# Patient Record
Sex: Female | Born: 1980
Health system: Southern US, Community
[De-identification: ages and names within clinical notes are randomized; demographics above are authoritative.]

## PROBLEM LIST (undated history)

## (undated) DIAGNOSIS — F32A Depression, unspecified: Secondary | ICD-10-CM

## (undated) DIAGNOSIS — Z8669 Personal history of other diseases of the nervous system and sense organs: Secondary | ICD-10-CM

## (undated) DIAGNOSIS — F329 Major depressive disorder, single episode, unspecified: Secondary | ICD-10-CM

## (undated) DIAGNOSIS — T7840XA Allergy, unspecified, initial encounter: Secondary | ICD-10-CM

## (undated) DIAGNOSIS — B9689 Other specified bacterial agents as the cause of diseases classified elsewhere: Secondary | ICD-10-CM

## (undated) DIAGNOSIS — M51369 Other intervertebral disc degeneration, lumbar region without mention of lumbar back pain or lower extremity pain: Secondary | ICD-10-CM

## (undated) DIAGNOSIS — N898 Other specified noninflammatory disorders of vagina: Principal | ICD-10-CM

## (undated) DIAGNOSIS — F419 Anxiety disorder, unspecified: Secondary | ICD-10-CM

## (undated) DIAGNOSIS — N760 Acute vaginitis: Secondary | ICD-10-CM

## (undated) DIAGNOSIS — B001 Herpesviral vesicular dermatitis: Secondary | ICD-10-CM

## (undated) DIAGNOSIS — M5136 Other intervertebral disc degeneration, lumbar region: Secondary | ICD-10-CM

## (undated) DIAGNOSIS — I10 Essential (primary) hypertension: Secondary | ICD-10-CM

## (undated) HISTORY — DX: Herpesviral vesicular dermatitis: B00.1

## (undated) HISTORY — DX: Personal history of other diseases of the nervous system and sense organs: Z86.69

## (undated) HISTORY — DX: Depression, unspecified: F32.A

## (undated) HISTORY — PX: ABDOMINAL HYSTERECTOMY: SHX81

## (undated) HISTORY — DX: Other intervertebral disc degeneration, lumbar region: M51.36

## (undated) HISTORY — DX: Essential (primary) hypertension: I10

## (undated) HISTORY — PX: LAPAROSCOPIC GASTRIC SLEEVE RESECTION: SHX5895

## (undated) HISTORY — PX: CHOLECYSTECTOMY: SHX55

## (undated) HISTORY — DX: Allergy, unspecified, initial encounter: T78.40XA

## (undated) HISTORY — DX: Acute vaginitis: N76.0

## (undated) HISTORY — DX: Anxiety disorder, unspecified: F41.9

## (undated) HISTORY — PX: TUBAL LIGATION: SHX77

## (undated) HISTORY — DX: Other intervertebral disc degeneration, lumbar region without mention of lumbar back pain or lower extremity pain: M51.369

## (undated) HISTORY — DX: Other specified noninflammatory disorders of vagina: N89.8

## (undated) HISTORY — PX: ENDOMETRIAL ABLATION: SHX621

## (undated) HISTORY — DX: Morbid (severe) obesity due to excess calories: E66.01

## (undated) HISTORY — DX: Other specified bacterial agents as the cause of diseases classified elsewhere: B96.89

## (undated) HISTORY — DX: Major depressive disorder, single episode, unspecified: F32.9

---
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
## 2003-05-16 ENCOUNTER — Other Ambulatory Visit: Admission: RE | Admit: 2003-05-16 | Discharge: 2003-05-16 | Payer: Self-pay | Admitting: Dermatology

## 2003-08-27 ENCOUNTER — Ambulatory Visit (HOSPITAL_COMMUNITY): Admission: RE | Admit: 2003-08-27 | Discharge: 2003-08-27 | Payer: Self-pay | Admitting: Family Medicine

## 2003-12-17 ENCOUNTER — Ambulatory Visit (HOSPITAL_COMMUNITY): Admission: RE | Admit: 2003-12-17 | Discharge: 2003-12-17 | Payer: Self-pay | Admitting: Family Medicine

## 2003-12-19 ENCOUNTER — Emergency Department (HOSPITAL_COMMUNITY): Admission: EM | Admit: 2003-12-19 | Discharge: 2003-12-19 | Payer: Self-pay | Admitting: Emergency Medicine

## 2004-10-09 ENCOUNTER — Ambulatory Visit (HOSPITAL_COMMUNITY): Admission: RE | Admit: 2004-10-09 | Discharge: 2004-10-10 | Payer: Self-pay | Admitting: Obstetrics and Gynecology

## 2004-10-16 ENCOUNTER — Inpatient Hospital Stay (HOSPITAL_COMMUNITY): Admission: RE | Admit: 2004-10-16 | Discharge: 2004-10-18 | Payer: Self-pay | Admitting: Obstetrics and Gynecology

## 2004-11-20 ENCOUNTER — Ambulatory Visit (HOSPITAL_COMMUNITY): Admission: RE | Admit: 2004-11-20 | Discharge: 2004-11-20 | Payer: Self-pay | Admitting: Obstetrics and Gynecology

## 2004-11-21 ENCOUNTER — Observation Stay (HOSPITAL_COMMUNITY): Admission: EM | Admit: 2004-11-21 | Discharge: 2004-11-22 | Payer: Self-pay | Admitting: Emergency Medicine

## 2005-03-11 ENCOUNTER — Ambulatory Visit (HOSPITAL_COMMUNITY): Admission: RE | Admit: 2005-03-11 | Discharge: 2005-03-11 | Payer: Self-pay | Admitting: Family Medicine

## 2005-07-26 ENCOUNTER — Encounter
Admission: RE | Admit: 2005-07-26 | Discharge: 2005-10-24 | Payer: Self-pay | Admitting: Physical Medicine & Rehabilitation

## 2005-07-26 ENCOUNTER — Ambulatory Visit: Payer: Self-pay | Admitting: Physical Medicine & Rehabilitation

## 2005-08-15 IMAGING — US US TRANSVAGINAL NON-OB
1 series · 14 of 25 positions shown · non-contrast
Comparison: none

**DUPLICATE COPY for exam association in [facility] ? No change from original report, 08/28/03**
CLINICAL DATA: Chronic abdominal pain.  
 TRANSABDOMINAL AND TRANSVAGINAL PELVIC SONOGRAM

[Series 1: unknown · 0.26mm/px · 14 of 49 slices shown]
[im 1/49]
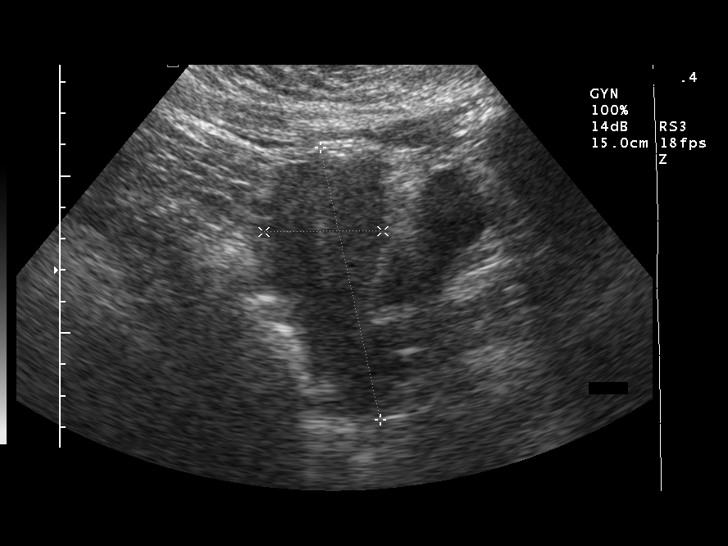
[im 5/49]
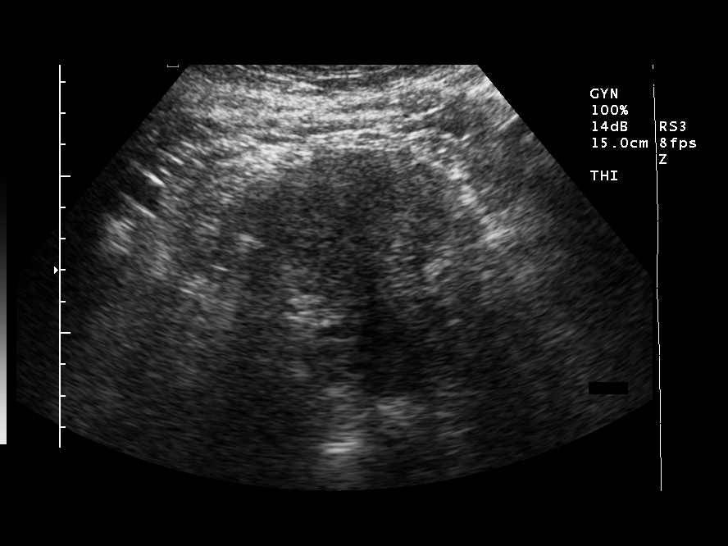
[im 9/49]
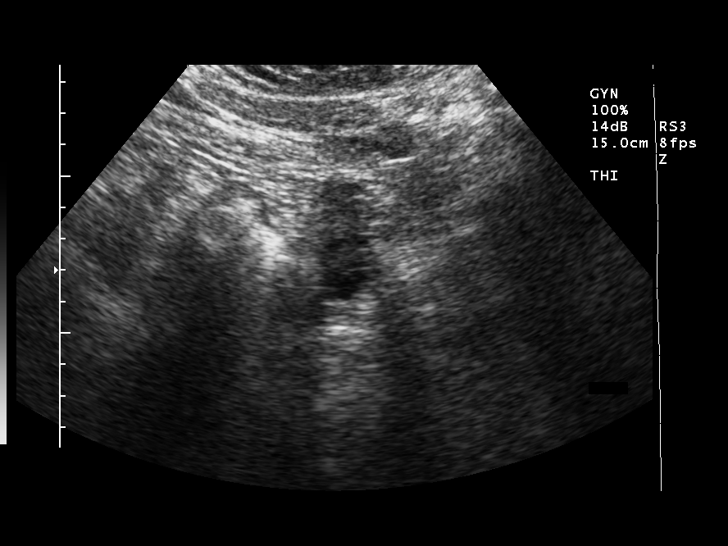
[im 13/49]
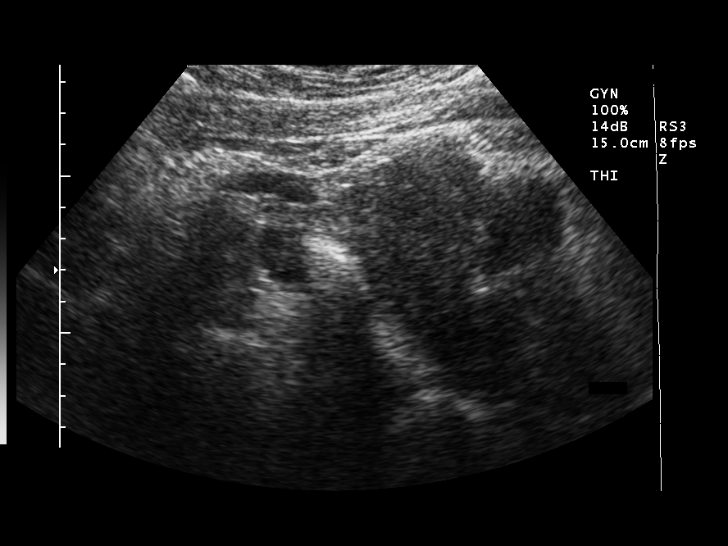
[im 17/49]
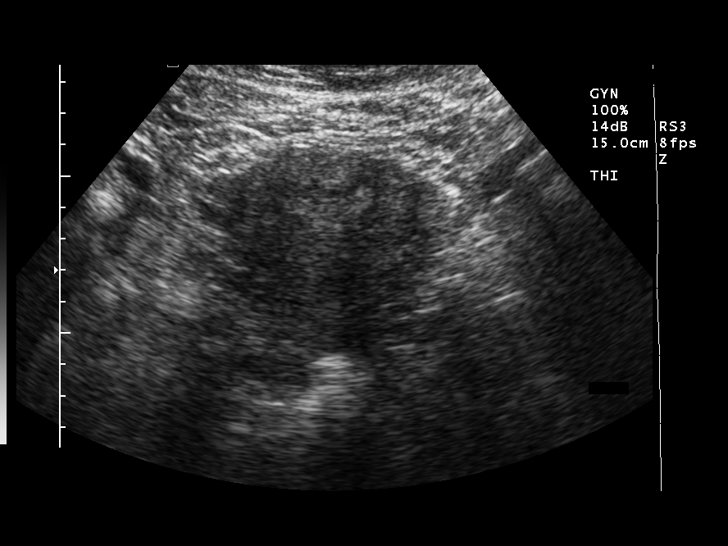
[im 19/49]
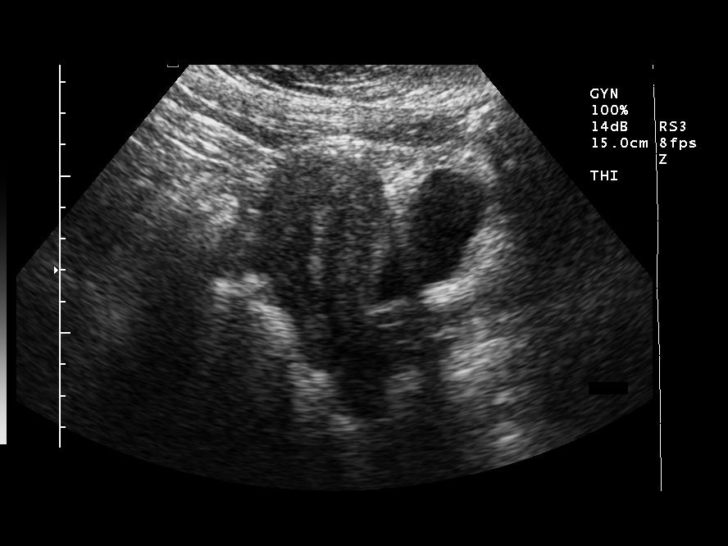
[im 23/49]
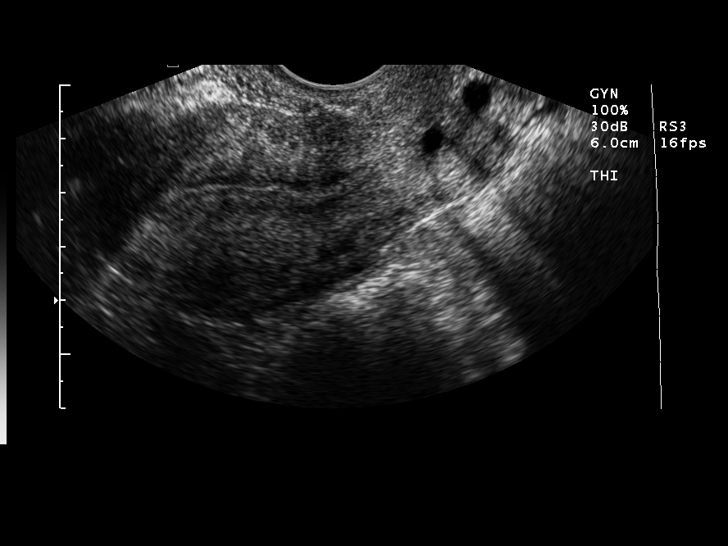
[im 27/49]
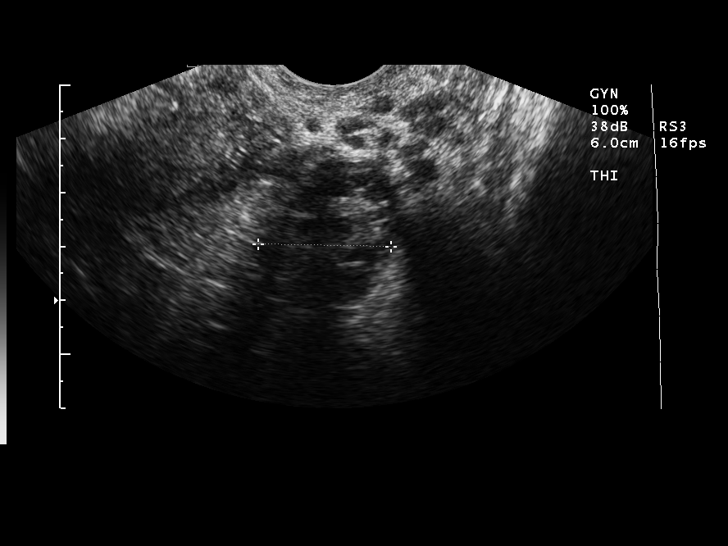
[im 31/49]
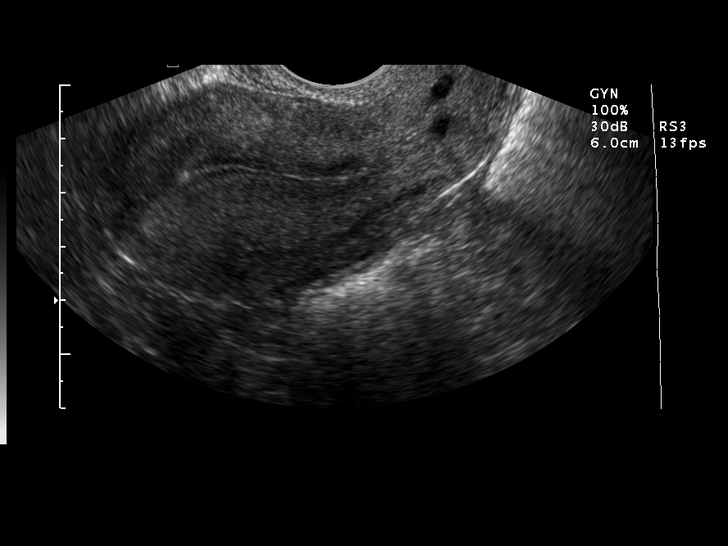
[im 33/49]
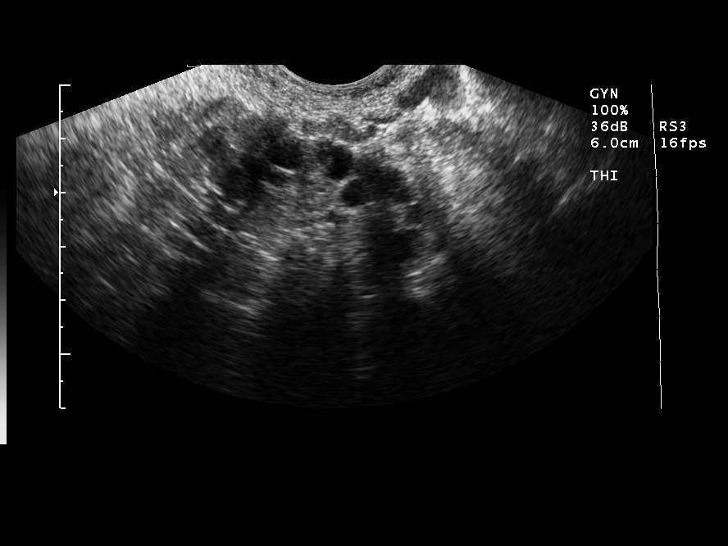
[im 37/49]
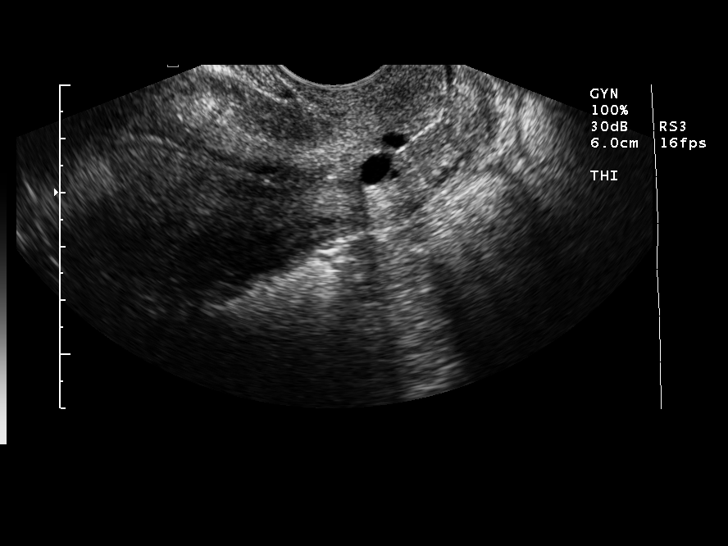
[im 41/49]
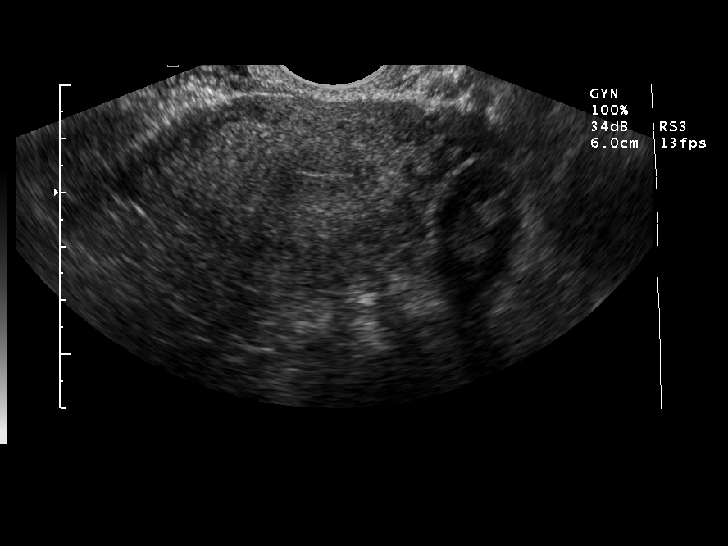
[im 45/49]
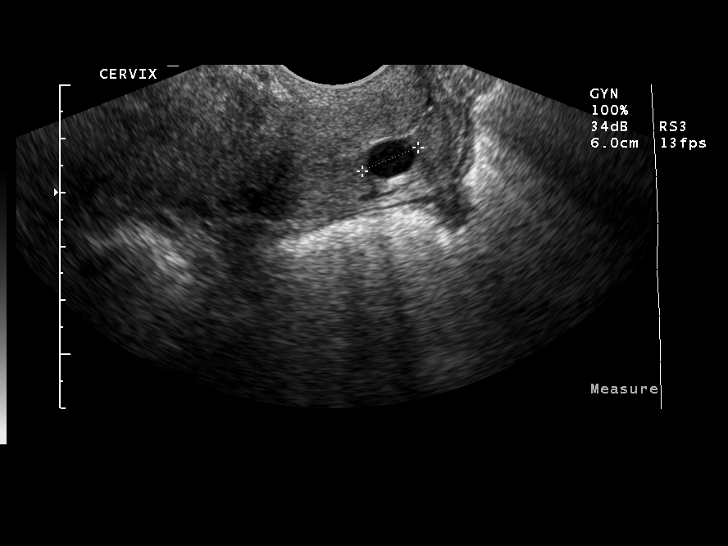
[im 49/49]
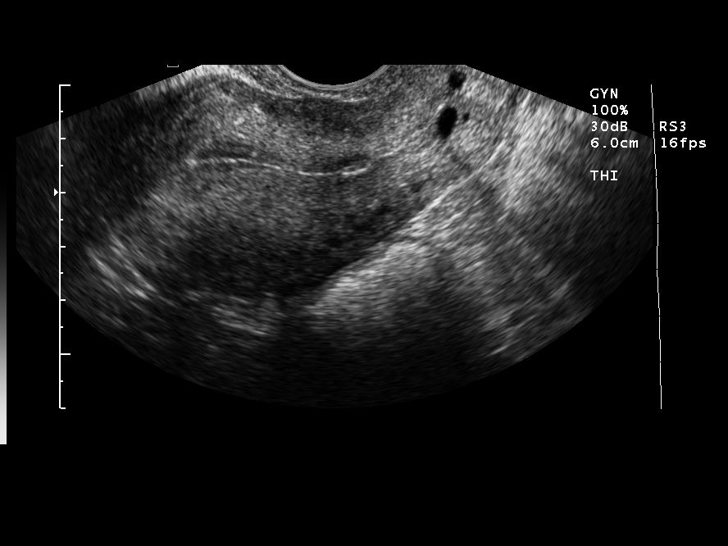

[14 of 25 positions shown; findings below may reference images not displayed]

FINDINGS: Uterus measures 8.9 X 3.8 X 6.4 cm.  Endometrial lining thickness 10 mm.  Small Nabothian cysts noted with the largest measuring up to 9 mm.  
 Right ovary measures 3.6 X 1.8 X 2.0 cm with the left ovary measuring 2.9 X 2.4 X 2.5 cm.  Several follicles noted within both ovaries without a dominant lesion.  No free fluid in the cul-de-sac.  
 IMPRESSION
 Nabothian cysts noted.  
 No dominant ovarian mass.

## 2012-03-02 ENCOUNTER — Ambulatory Visit (INDEPENDENT_AMBULATORY_CARE_PROVIDER_SITE_OTHER): Payer: Self-pay | Admitting: Surgery

## 2012-06-27 ENCOUNTER — Ambulatory Visit: Payer: Self-pay | Admitting: Family Medicine

## 2012-06-27 ENCOUNTER — Encounter: Payer: Self-pay | Admitting: Family Medicine

## 2012-06-27 ENCOUNTER — Ambulatory Visit (INDEPENDENT_AMBULATORY_CARE_PROVIDER_SITE_OTHER): Payer: Managed Care, Other (non HMO) | Admitting: Family Medicine

## 2012-06-27 VITALS — BP 128/89 | HR 80 | Ht 63.0 in | Wt 255.0 lb

## 2012-06-27 DIAGNOSIS — G43909 Migraine, unspecified, not intractable, without status migrainosus: Secondary | ICD-10-CM

## 2012-06-27 DIAGNOSIS — F411 Generalized anxiety disorder: Secondary | ICD-10-CM

## 2012-06-27 MED ORDER — NADOLOL 20 MG PO TABS: 20.0000 mg | ORAL_TABLET | Freq: Every day | ORAL | Status: DC

## 2012-06-27 MED ORDER — FLUOXETINE HCL 20 MG PO CAPS: 20.0000 mg | ORAL_CAPSULE | Freq: Every day | ORAL | Status: DC

## 2012-06-27 MED ORDER — PROMETHAZINE HCL 12.5 MG PO TABS: 12.5000 mg | ORAL_TABLET | Freq: Four times a day (QID) | ORAL | Status: DC | PRN

## 2012-06-27 NOTE — Progress Notes (Signed)
  Subjective:    Patient ID: Crystal Rojas, female    DOB: [birth year], 32 y.o.   MRN: [id number]  Crystal Rojas is here today to discuss the conditions listed below.  1) Migraine Headaches:  She took the combination of Topamax and Maxalt for about three weeks.  She stopped them because she did not feel well on the combination.  She says that she felt that she was going "crazy" while on these medications.  She stopped them and has been taking Exedrin with little relief.   2)   Anxiety:  She weaned herself off her Cymbalta and she feels since then she has been having chest pain.  Her anixety level also has increased.   Migraine  This is a chronic problem. The current episode started more than 1 year ago. The problem occurs intermittently (2 or more per week). The pain is located in the frontal, occipital, parietal, temporal and bilateral region. The pain does not radiate. The pain quality is similar to prior headaches. The quality of the pain is described as throbbing, aching, boring, dull, pulsating, sharp and stabbing. Pain scale: "Sick Migraine - 10/10; Regular 7/10"  Pain severity now: Ranges from mild to severe  Associated symptoms include blurred vision, nausea, phonophobia, photophobia, scalp tenderness, sinus pressure and vomiting. Pertinent negatives include no eye pain or neck pain. The symptoms are aggravated by bright light, caffeine withdrawal, coughing, emotional stress, fatigue, noise and sneezing. She has tried acetaminophen, Excedrin and triptans for the symptoms.  Anxiety Presents for initial visit. Onset was 1 to 4 weeks ago. The problem has been unchanged. Symptoms include chest pain, depressed mood, excessive worry, irritability, malaise, nausea and nervous/anxious behavior. Patient reports no palpitations, panic, restlessness or suicidal ideas. Symptoms occur most days. The severity of symptoms is moderate. The symptoms are aggravated by family issues and work stress. The quality of sleep is  good. Nighttime awakenings: none.   Risk factors include a major life event (Husband being out of work ). Her past medical history is significant for anxiety/panic attacks and depression. Treatments tried: Cymbalta  The treatment provided significant relief. Compliance with prior treatments has been good. Past compliance problems: Cost of medication      Review of Systems  Constitutional: Positive for irritability.  HENT: Positive for sinus pressure. Negative for neck pain.   Eyes: Positive for blurred vision and photophobia. Negative for pain.  Cardiovascular: Positive for chest pain. Negative for palpitations.  Gastrointestinal: Positive for nausea and vomiting.  Psychiatric/Behavioral: Negative for suicidal ideas. The patient is nervous/anxious.        Objective:   Physical Exam  Constitutional: She appears well-nourished. No distress.  HENT:  Head: Normocephalic.  Eyes: No scleral icterus.  Neck: No thyromegaly present.  Cardiovascular: Normal rate, regular rhythm and normal heart sounds.   Pulmonary/Chest: Effort normal and breath sounds normal.  Abdominal: There is no tenderness.  Musculoskeletal: She exhibits no edema and no tenderness.  Neurological: She is alert.  Skin: Skin is warm and dry.  Psychiatric: Her behavior is normal. Judgment and thought content normal.  She is stressed.           Assessment & Plan:   TIME 30 MINUTES:  MORE THAN 50 % OF TIME WAS INVOLVED IN COUNSELING.

## 2012-06-27 NOTE — Patient Instructions (Addendum)
1)  MIgraine - Take 1 Nadolol daily for prevention.  If you still need more help with prevention start on some L-Tyrosine before trying the Topamax again.  Start with the 25 mg at night and increase very slowly.  You can also try some Migrelief.    2)  Mood - Start on Prozac first for a couple of weeks before starting the Nadolol.  You should notice less stress in 4-6 weeks.    Diet Following Bariatric Surgery The bariatric diet is designed to provide fluids and nourishment while promoting weight loss after bariatric surgery. The diet is divided into 3 stages. The rate of progression varies based on individual food tolerance. DIET FOLLOWING BARIATRIC SURGERY The diet following surgery is divided into 3 stages to allow a gradual adjustment. It is very important to the success of your surgery to:  Progress to each stage slowly.  Eat at set times.  Chew food well and stop eating when you are full.  Not drink liquids 30 minutes before and after meals. If you feel tightness or pressure in your chest, that means you are full. Wait 30 minutes before you try to eat again. STAGE 1 BARIATRIC DIET - ABOUT 2 WEEKS IN DURATION   The diet begins the day of surgery. It will last about 1 to 2 weeks after surgery. Your surgeon may have individual guidelines for you about specific foods or the progression of your diet. Follow your surgeon's guidelines.  If clear liquids are well-tolerated without vomiting, your caregiver will add a 4 oz to 6 oz high protein, low-calorie liquid supplement. You could add this to your meal plan 3 times daily. You will need at least 60 g to 80 g of protein daily or as determined by your Registered Dietitian.  Guidelines for choosing a protein supplement include:  At least 15 g of protein per 8 oz serving.  Less than 20 g total carbohydrate per 8 oz serving.  Less than 5 g fat per 8 oz serving.  Avoid carbonated beverages, caffeine, alcohol, and concentrated sweets such as  sugar, cakes, and cookies.  Right after surgery, you may only be able to eat 3 to 4 tsp per meal. Your maximum volume should not exceed  to  cup total. Do not eat or drink more than 1 oz or 2 tbs every 15 minutes.  Take a chewable multivitamin and mineral supplement.  Drink at least 48 oz of fluid daily, which includes your protein supplement. Food and beverages from the list below are allowed at set times (for example at 8 AM, 12 noon, or 5 PM):  Decaffeinated coffee or tea.  100% fruit juice.  Diet or sugar-free drinks.  Broth.  Blenderized soup.  Skim milk or lactose-free milk.  Sugar-free gelatin dessert or frozen ice pops.  Mashed potatoes.  Yogurt (artificially sweetened).  Sugar-free pudding.  Blended low-fat cottage cheese.  Unsweetened applesauce, grits, or hot wheat cereal. Four to six ounces of a liquid protein supplement from the list below is recommended for snacks at 10 AM, 2 PM, and 8 PM.  STAGE 2 BARIATRIC DIET (SOFT DIET) - ABOUT 4 WEEKS IN DURATION  About 2 weeks after surgery, your caregiver will progress your diet to this stage. Foods may need to be blended to the consistency of applesauce. Choose low-fat foods (less than 5 g of fat per serving) and avoid concentrated sweets and sugar (less than 10 g of sugar per serving). Meals should not exceed  to  cup total. This stage will last about 4 weeks. It is recommended that you meet with your dietitian at this stage to begin preparation for the last stage. This stage consists of 3 meals a day with a liquid protein supplement between meals twice daily. Do not drink liquids with foods. You must wait 30 minutes for the stomach pouch to empty before drinking. Chew food well. The food must be almost liquified before swallowing. Soft foods from the list below can now be slowly added to your diet:  Soft fruit (soft canned fruit in light syrup or natural juice, banana, melon, peaches, pears, or  strawberries).  Cooked vegetables.  Toast or crackers (becomes soft after chewing 20 times).  Hot wheat cereal.  Fish.  Eggs (scrambled, soft-boiled). STAGE 3 BARIATRIC DIET (REGULAR DIET) - ABOUT 6 to 8 WEEKS AFTER SURGERY About 6 to 8 weeks after surgery, you will be advanced to food that is regular in texture. This diet should include all food groups. The diet will continue to promote weight loss. Meals should not exceed  to 1 cup total. Your dietitian will be available to assist you in meal planning and additional behavioral strategies to make this final stage a long-term success. Slowly add foods of regular consistency and remember:  Eat only at your chosen meal times.  Minimize drinking with meals. You should drink 30 minutes before eating. Do not start drinking again for about 2 hours after eating.  Chew food well. Take small bites.  Think about the portion size of a healthy frozen meal. You will be able to eat most of this.  Make sure your meal is balanced with starch, protein, fruits, and vegetables.  When you feel full, stop eating. Document Released: 08/08/2002 Document Revised: 04/26/2011 Document Reviewed: 05/01/2010 [facility] Patient Information 2013 [place], [place].

## 2012-07-12 DIAGNOSIS — G43909 Migraine, unspecified, not intractable, without status migrainosus: Secondary | ICD-10-CM | POA: Insufficient documentation

## 2012-07-12 DIAGNOSIS — F411 Generalized anxiety disorder: Secondary | ICD-10-CM | POA: Insufficient documentation

## 2012-07-12 NOTE — Assessment & Plan Note (Signed)
She is to get started on Prozac.

## 2012-07-12 NOTE — Assessment & Plan Note (Signed)
She is to continue to try to work on diet and exercise.

## 2012-07-12 NOTE — Assessment & Plan Note (Signed)
She'll start on some nadolol to prevent her migraines.

## 2012-07-14 ENCOUNTER — Telehealth: Payer: Self-pay | Admitting: Family Medicine

## 2012-07-24 NOTE — Telephone Encounter (Signed)
Sorry I didn't get to this message before.  I don't know the status of it.  She may have left her forms with Dr Herma Carson.  Can you check with Dr Alberteen Sam if the forms are completed?  I don't know anything about these forms. PG

## 2012-08-29 ENCOUNTER — Other Ambulatory Visit: Payer: 59

## 2012-09-05 ENCOUNTER — Other Ambulatory Visit: Payer: 59

## 2012-09-12 ENCOUNTER — Encounter: Payer: 59 | Admitting: Family Medicine

## 2012-09-18 ENCOUNTER — Ambulatory Visit (HOSPITAL_COMMUNITY)
Admission: RE | Admit: 2012-09-18 | Discharge: 2012-09-18 | Disposition: A | Discharge status: Home / Self Care | Payer: Managed Care, Other (non HMO) | Source: Ambulatory Visit | Attending: Family Medicine | Admitting: Family Medicine

## 2012-09-18 ENCOUNTER — Other Ambulatory Visit (HOSPITAL_COMMUNITY): Payer: Self-pay | Admitting: Family Medicine

## 2012-09-18 DIAGNOSIS — R110 Nausea: Secondary | ICD-10-CM

## 2012-09-18 DIAGNOSIS — R109 Unspecified abdominal pain: Secondary | ICD-10-CM | POA: Insufficient documentation

## 2013-09-14 ENCOUNTER — Encounter: Payer: Self-pay | Admitting: Obstetrics & Gynecology

## 2013-09-14 ENCOUNTER — Other Ambulatory Visit: Payer: Self-pay | Admitting: Obstetrics & Gynecology

## 2013-09-14 ENCOUNTER — Ambulatory Visit (INDEPENDENT_AMBULATORY_CARE_PROVIDER_SITE_OTHER): Payer: Private Health Insurance - Indemnity | Admitting: Obstetrics & Gynecology

## 2013-09-14 ENCOUNTER — Ambulatory Visit (INDEPENDENT_AMBULATORY_CARE_PROVIDER_SITE_OTHER): Payer: Private Health Insurance - Indemnity

## 2013-09-14 VITALS — BP 140/90 | Ht 63.0 in | Wt 253.0 lb

## 2013-09-14 DIAGNOSIS — N842 Polyp of vagina: Secondary | ICD-10-CM

## 2013-09-14 DIAGNOSIS — IMO0002 Reserved for concepts with insufficient information to code with codable children: Secondary | ICD-10-CM

## 2013-09-14 DIAGNOSIS — R3915 Urgency of urination: Secondary | ICD-10-CM

## 2013-09-14 LAB — POCT URINALYSIS DIPSTICK
Blood, UA: NEGATIVE
Glucose, UA: NEGATIVE
Ketones, UA: NEGATIVE
Nitrite, UA: NEGATIVE

## 2013-09-14 MED ORDER — MIRABEGRON ER 50 MG PO TB24: 50.0000 mg | ORAL_TABLET | Freq: Every day | ORAL | Status: DC

## 2013-09-14 NOTE — Progress Notes (Signed)
Patient ID: Crystal Rojas, female   DOB: 1981-02-04, 33 y.o.   MRN: [id number] GYNECOLOGIC SONOGRAM  Crystal Lorononya M Purdy is a 33 y.o. for a pelvic sonogram for dyspareunia. S/P hysterectomy 01/2013  Uterus Surgically Absent- vaginal Cuff noted (difficult to evaluate due to meds placed vaginally causing shadowing)  Right ovary 2.9 x 2.7 x 2.1 cm,  Left ovary 4.2 x 3.4 x 3.4 cm, noted at vaginal cuff (translabial approach used to image Lt ovary)  No free fluid or adnexal masses noted within the pelvis  Technician Comments:  Vaginal Cuff noted, Rt ovary appears WNL, Lt ovary appears @ vaginal cuff (translabial approach used to image LT ovary) no free fluid or adnexal masses noted  Chari ManningMcBride, Tasha  09/14/2013  12:50 PM  Clinical Impression and recommendations:  I have reviewed the sonogram results above, combined with the patient's current clinical course, below are my impressions and any appropriate recommendations for management based on the sonographic findings.  Adherent left ovary to the vaginal cuff  Right ovary normal  EURE,LUTHER H  09/14/2013  1:12 PM My concern is her left ovary is adherent to the left pelvic sidewall vs top of vaginal cuff  Exam Quite tender to palaption on left Adherent it feels Cuff intact nefg  Recommend laparoscopic removal of adherent left ovary

## 2013-09-25 ENCOUNTER — Encounter (HOSPITAL_COMMUNITY): Payer: Self-pay | Admitting: Pharmacy Technician

## 2013-10-03 ENCOUNTER — Other Ambulatory Visit: Payer: Self-pay | Admitting: Obstetrics & Gynecology

## 2013-10-03 NOTE — Patient Instructions (Signed)
Crystal Rojas  10/03/2013   Your procedure is scheduled on:  10/10/13  [facility] at [facility]:50 AM.  Call this number if you have problems the morning of surgery: [phone]   Remember:   Do not eat food or drink liquids after midnight.   Take these medicines the morning of surgery with A SIP OF WATER: NONE   Do not wear jewelry, make-up or nail polish.  Do not wear lotions, powders, or perfumes. You may wear deodorant.  Do not shave 48 hours prior to surgery. Men may shave face and neck.  Do not bring valuables to the hospital.  [facility] for any belongings or valuables.               Contacts, dentures or bridgework may not be worn into surgery.  Leave suitcase in the car. After surgery it may be brought to your room.  For patients admitted to the hospital, discharge time is determined by your treatment team.               Patients discharged the day of surgery will not be allowed to drive home.  Name and phone number of your driver:   Special Instructions: Shower using CHG 2 nights before surgery and the night before surgery.  If you shower the day of surgery use CHG.  Use special wash - you have one bottle of CHG for all showers.  You should use approximately 1/3 of the bottle for each shower.   Please read over the following fact sheets that you were given: Surgical Site Infection Prevention and Anesthesia Post-op Instructions  PATIENT INSTRUCTIONS POST-ANESTHESIA  IMMEDIATELY FOLLOWING SURGERY:  Do not drive or operate machinery for the first twenty four hours after surgery.  Do not make any important decisions for twenty four hours after surgery or while taking narcotic pain medications or sedatives.  If you develop intractable nausea and vomiting or a severe headache please notify your doctor immediately.  FOLLOW-UP:  Please make an appointment with your surgeon as instructed. You do not need to follow up with anesthesia unless specifically  instructed to do so.  WOUND CARE INSTRUCTIONS (if applicable):  Keep a dry clean dressing on the anesthesia/puncture wound site if there is drainage.  Once the wound has quit draining you may leave it open to air.  Generally you should leave the bandage intact for twenty four hours unless there is drainage.  If the epidural site drains for more than 36-48 hours please call the anesthesia department.  QUESTIONS?:  Please feel free to call your physician or the hospital operator if you have any questions, and they will be happy to assist you.

## 2013-10-04 ENCOUNTER — Encounter (HOSPITAL_COMMUNITY)
Admission: RE | Admit: 2013-10-04 | Discharge: 2013-10-04 | Disposition: A | Discharge status: Home / Self Care | Payer: Managed Care, Other (non HMO) | Source: Ambulatory Visit | Attending: Obstetrics & Gynecology | Admitting: Obstetrics & Gynecology

## 2013-10-04 ENCOUNTER — Encounter (HOSPITAL_COMMUNITY): Payer: Self-pay

## 2013-10-04 DIAGNOSIS — IMO0002 Reserved for concepts with insufficient information to code with codable children: Secondary | ICD-10-CM | POA: Insufficient documentation

## 2013-10-04 DIAGNOSIS — Z01812 Encounter for preprocedural laboratory examination: Secondary | ICD-10-CM | POA: Insufficient documentation

## 2013-10-04 DIAGNOSIS — Z01818 Encounter for other preprocedural examination: Secondary | ICD-10-CM | POA: Diagnosis not present

## 2013-10-04 LAB — COMPREHENSIVE METABOLIC PANEL
ALT: 20 U/L (ref 0–35)
AST: 17 U/L (ref 0–37)
Albumin: 3.8 g/dL (ref 3.5–5.2)
Alkaline Phosphatase: 66 U/L (ref 39–117)
Anion gap: 12 (ref 5–15)
BILIRUBIN TOTAL: 0.5 mg/dL (ref 0.3–1.2)
BUN: 13 mg/dL (ref 6–23)
CO2: 25 meq/L (ref 19–32)
Calcium: 9.3 mg/dL (ref 8.4–10.5)
Chloride: 104 mEq/L (ref 96–112)
Creatinine, Ser: 0.69 mg/dL (ref 0.50–1.10)
GFR calc Af Amer: >90 mL/min (ref >90)
GFR calc non Af Amer: >90 mL/min (ref >90)
Glucose, Bld: 97 mg/dL (ref 70–99)
Potassium: 4.2 mEq/L (ref 3.7–5.3)
Sodium: 141 mEq/L (ref 137–147)
Total Protein: 7.1 g/dL (ref 6.0–8.3)

## 2013-10-04 LAB — URINALYSIS, ROUTINE W REFLEX MICROSCOPIC
Bilirubin Urine: NEGATIVE
Glucose, UA: NEGATIVE mg/dL
Hgb urine dipstick: NEGATIVE
Ketones, ur: NEGATIVE mg/dL
Leukocytes, UA: NEGATIVE
Nitrite: NEGATIVE
Protein, ur: NEGATIVE mg/dL
Specific Gravity, Urine: 1.03 (ref 1.005–1.030)
Urobilinogen, UA: 1 mg/dL (ref 0.0–1.0)
pH: 5 (ref 5.0–8.0)

## 2013-10-04 LAB — CBC
HCT: 40.1 % (ref 36.0–46.0)
Hemoglobin: 13.4 g/dL (ref 12.0–15.0)
MCH: 27.2 pg (ref 26.0–34.0)
MCHC: 33.4 g/dL (ref 30.0–36.0)
MCV: 81.5 fL (ref 78.0–100.0)
Platelets: 187 10*3/uL (ref 150–400)
RBC: 4.92 MIL/uL (ref 3.87–5.11)
RDW: 13.3 % (ref 11.5–15.5)
WBC: 7.4 10*3/uL (ref 4.0–10.5)

## 2013-10-04 LAB — TYPE AND SCREEN
ABO/RH(D): O POS
Antibody Screen: NEGATIVE

## 2013-10-04 NOTE — Pre-Procedure Instructions (Signed)
Patient given information to sign up for my chart at home. 

## 2013-10-10 ENCOUNTER — Ambulatory Visit (HOSPITAL_COMMUNITY): Payer: Managed Care, Other (non HMO) | Admitting: Anesthesiology

## 2013-10-10 ENCOUNTER — Encounter (HOSPITAL_COMMUNITY)
Admission: RE | Disposition: A | Discharge status: Home / Self Care | Payer: Self-pay | Source: Ambulatory Visit | Attending: Obstetrics & Gynecology

## 2013-10-10 ENCOUNTER — Encounter (HOSPITAL_COMMUNITY): Payer: Managed Care, Other (non HMO) | Admitting: Anesthesiology

## 2013-10-10 ENCOUNTER — Ambulatory Visit (HOSPITAL_COMMUNITY)
Admission: RE | Admit: 2013-10-10 | Discharge: 2013-10-10 | Disposition: A | Discharge status: Home / Self Care | Payer: Managed Care, Other (non HMO) | Source: Ambulatory Visit | Attending: Obstetrics & Gynecology | Admitting: Obstetrics & Gynecology

## 2013-10-10 DIAGNOSIS — N736 Female pelvic peritoneal adhesions (postinfective): Secondary | ICD-10-CM | POA: Diagnosis not present

## 2013-10-10 DIAGNOSIS — Z79899 Other long term (current) drug therapy: Secondary | ICD-10-CM | POA: Diagnosis not present

## 2013-10-10 DIAGNOSIS — IMO0002 Reserved for concepts with insufficient information to code with codable children: Secondary | ICD-10-CM | POA: Diagnosis present

## 2013-10-10 DIAGNOSIS — I10 Essential (primary) hypertension: Secondary | ICD-10-CM | POA: Diagnosis not present

## 2013-10-10 DIAGNOSIS — F411 Generalized anxiety disorder: Secondary | ICD-10-CM | POA: Insufficient documentation

## 2013-10-10 DIAGNOSIS — N8300 Follicular cyst of ovary, unspecified side: Secondary | ICD-10-CM

## 2013-10-10 DIAGNOSIS — Z90710 Acquired absence of both cervix and uterus: Secondary | ICD-10-CM | POA: Diagnosis not present

## 2013-10-10 HISTORY — PX: LAPAROSCOPIC SALPINGO OOPHERECTOMY: SHX5927

## 2013-10-10 HISTORY — PX: LAPAROSCOPIC LYSIS OF ADHESIONS: SHX5905

## 2013-10-10 SURGERY — SALPINGO-OOPHORECTOMY, LAPAROSCOPIC
Anesthesia: General | Site: Abdomen

## 2013-10-10 MED ORDER — PROPOFOL 10 MG/ML IV BOLUS
INTRAVENOUS | Status: DC | PRN
  Administered 2013-10-10: 150 mg via INTRAVENOUS

## 2013-10-10 MED ORDER — FENTANYL CITRATE 0.05 MG/ML IJ SOLN
INTRAMUSCULAR | Status: AC
  Filled 2013-10-10: qty 5

## 2013-10-10 MED ORDER — KETOROLAC TROMETHAMINE 10 MG PO TABS: 10.0000 mg | ORAL_TABLET | Freq: Three times a day (TID) | ORAL | Status: DC | PRN

## 2013-10-10 MED ORDER — FENTANYL CITRATE 0.05 MG/ML IJ SOLN
INTRAMUSCULAR | Status: AC
  Filled 2013-10-10: qty 2

## 2013-10-10 MED ORDER — MIDAZOLAM HCL 2 MG/2ML IJ SOLN
INTRAMUSCULAR | Status: AC
  Filled 2013-10-10: qty 2

## 2013-10-10 MED ORDER — KETOROLAC TROMETHAMINE 30 MG/ML IJ SOLN
INTRAMUSCULAR | Status: AC
  Filled 2013-10-10: qty 1

## 2013-10-10 MED ORDER — ONDANSETRON HCL 4 MG/2ML IJ SOLN
4.0000 mg | Freq: Once | INTRAMUSCULAR | Status: AC | PRN
  Administered 2013-10-10: 4 mg via INTRAVENOUS

## 2013-10-10 MED ORDER — ONDANSETRON HCL 4 MG/2ML IJ SOLN
INTRAMUSCULAR | Status: AC
  Filled 2013-10-10: qty 2

## 2013-10-10 MED ORDER — 0.9 % SODIUM CHLORIDE (POUR BTL) OPTIME
TOPICAL | Status: DC | PRN
  Administered 2013-10-10: 1000 mL

## 2013-10-10 MED ORDER — FENTANYL CITRATE 0.05 MG/ML IJ SOLN
25.0000 ug | INTRAMUSCULAR | Status: DC | PRN
  Administered 2013-10-10: 50 ug via INTRAVENOUS

## 2013-10-10 MED ORDER — GLYCOPYRROLATE 0.2 MG/ML IJ SOLN
INTRAMUSCULAR | Status: AC
  Filled 2013-10-10: qty 2

## 2013-10-10 MED ORDER — SODIUM CHLORIDE 0.9 % IR SOLN
Status: DC | PRN
  Administered 2013-10-10: 3000 mL

## 2013-10-10 MED ORDER — FENTANYL CITRATE 0.05 MG/ML IJ SOLN
INTRAMUSCULAR | Status: DC | PRN
  Administered 2013-10-10 (×7): 50 ug via INTRAVENOUS

## 2013-10-10 MED ORDER — PROPOFOL 10 MG/ML IV EMUL
INTRAVENOUS | Status: AC
  Filled 2013-10-10: qty 20

## 2013-10-10 MED ORDER — LIDOCAINE HCL 1 % IJ SOLN
INTRAMUSCULAR | Status: DC | PRN
  Administered 2013-10-10: 50 mg via INTRADERMAL

## 2013-10-10 MED ORDER — CEFAZOLIN SODIUM-DEXTROSE 2-3 GM-% IV SOLR
2.0000 g | INTRAVENOUS | Status: AC
  Administered 2013-10-10: 2 g via INTRAVENOUS
  Filled 2013-10-10: qty 50

## 2013-10-10 MED ORDER — ONDANSETRON HCL 8 MG PO TABS: 8.0000 mg | ORAL_TABLET | Freq: Three times a day (TID) | ORAL | Status: DC | PRN

## 2013-10-10 MED ORDER — MIDAZOLAM HCL 2 MG/2ML IJ SOLN
1.0000 mg | INTRAMUSCULAR | Status: AC | PRN
  Administered 2013-10-10 (×3): 2 mg via INTRAVENOUS
  Filled 2013-10-10 (×2): qty 2

## 2013-10-10 MED ORDER — NEOSTIGMINE METHYLSULFATE 10 MG/10ML IV SOLN
INTRAVENOUS | Status: DC | PRN
  Administered 2013-10-10: 2 mg via INTRAVENOUS
  Administered 2013-10-10: 1 mg via INTRAVENOUS

## 2013-10-10 MED ORDER — ONDANSETRON HCL 4 MG/2ML IJ SOLN
4.0000 mg | Freq: Once | INTRAMUSCULAR | Status: AC
Start: 2013-10-10 — End: 2013-10-10
  Administered 2013-10-10: 4 mg via INTRAVENOUS

## 2013-10-10 MED ORDER — MIDAZOLAM HCL 5 MG/5ML IJ SOLN
INTRAMUSCULAR | Status: DC | PRN
  Administered 2013-10-10: 2 mg via INTRAVENOUS

## 2013-10-10 MED ORDER — BUPIVACAINE LIPOSOME 1.3 % IJ SUSP
INTRAMUSCULAR | Status: DC | PRN
  Administered 2013-10-10: 20 mL

## 2013-10-10 MED ORDER — GLYCOPYRROLATE 0.2 MG/ML IJ SOLN
INTRAMUSCULAR | Status: DC | PRN
Start: 2013-10-10 — End: 2013-10-10
  Administered 2013-10-10: 0.4 mg via INTRAVENOUS

## 2013-10-10 MED ORDER — BUPIVACAINE LIPOSOME 1.3 % IJ SUSP
INTRAMUSCULAR | Status: AC
  Filled 2013-10-10: qty 20

## 2013-10-10 MED ORDER — KETOROLAC TROMETHAMINE 30 MG/ML IJ SOLN
30.0000 mg | Freq: Once | INTRAMUSCULAR | Status: AC
  Administered 2013-10-10: 30 mg via INTRAVENOUS

## 2013-10-10 MED ORDER — BUPIVACAINE LIPOSOME 1.3 % IJ SUSP: 20.0000 mL | Freq: Once | INTRAMUSCULAR | Status: DC

## 2013-10-10 MED ORDER — OXYCODONE-ACETAMINOPHEN 7.5-325 MG PO TABS: 1.0000 | ORAL_TABLET | Freq: Four times a day (QID) | ORAL | Status: DC | PRN

## 2013-10-10 MED ORDER — NEOSTIGMINE METHYLSULFATE 10 MG/10ML IV SOLN
INTRAVENOUS | Status: AC
  Filled 2013-10-10: qty 1

## 2013-10-10 MED ORDER — ROCURONIUM BROMIDE 100 MG/10ML IV SOLN
INTRAVENOUS | Status: DC | PRN
  Administered 2013-10-10: 40 mg via INTRAVENOUS

## 2013-10-10 MED ORDER — LACTATED RINGERS IV SOLN
INTRAVENOUS | Status: DC
  Administered 2013-10-10: 1000 mL via INTRAVENOUS
  Administered 2013-10-10: 11:00:00 via INTRAVENOUS

## 2013-10-10 SURGICAL SUPPLY — 45 items
APPLIER CLIP UNV 5X34 EPIX (ENDOMECHANICALS) ×5 IMPLANT
APR XCLPCLP 20M/L UNV 34X5 (ENDOMECHANICALS) ×3
BAG HAMPER (MISCELLANEOUS) ×5 IMPLANT
BAG SPEC RTRVL LRG 6X4 10 (ENDOMECHANICALS) ×3
BLADE SURG SZ11 CARB STEEL (BLADE) ×5 IMPLANT
CLOTH BEACON ORANGE TIMEOUT ST (SAFETY) ×5 IMPLANT
COVER LIGHT HANDLE STERIS (MISCELLANEOUS) ×10 IMPLANT
DRSG GAUZE PETRO 3X36 STRIP (GAUZE/BANDAGES/DRESSINGS) ×3 IMPLANT
ELECT REM PT RETURN 9FT ADLT (ELECTROSURGICAL) ×5
ELECTRODE REM PT RTRN 9FT ADLT (ELECTROSURGICAL) ×3 IMPLANT
FILTER SMOKE EVAC LAPAROSHD (FILTER) ×5 IMPLANT
FORMALIN 10 PREFIL 480ML (MISCELLANEOUS) ×5 IMPLANT
GLOVE BIOGEL PI IND STRL 7.0 (GLOVE) ×1 IMPLANT
GLOVE BIOGEL PI IND STRL 8 (GLOVE) ×3 IMPLANT
GLOVE BIOGEL PI INDICATOR 7.0 (GLOVE) ×2
GLOVE BIOGEL PI INDICATOR 8 (GLOVE) ×2
GLOVE ECLIPSE 6.5 STRL STRAW (GLOVE) ×3 IMPLANT
GLOVE ECLIPSE 8.0 STRL XLNG CF (GLOVE) ×5 IMPLANT
GLOVE EXAM NITRILE PF MED BLUE (GLOVE) ×3 IMPLANT
GOWN STRL REUS W/TWL LRG LVL3 (GOWN DISPOSABLE) ×7 IMPLANT
GOWN STRL REUS W/TWL XL LVL3 (GOWN DISPOSABLE) ×5 IMPLANT
INST SET LAPROSCOPIC GYN AP (KITS) ×5 IMPLANT
IV NS IRRIG 3000ML ARTHROMATIC (IV SOLUTION) ×3 IMPLANT
KIT ROOM TURNOVER APOR (KITS) ×5 IMPLANT
KIT TROCAR LAP GYN (TROCAR) ×5 IMPLANT
MANIFOLD NEPTUNE II (INSTRUMENTS) ×5 IMPLANT
NEEDLE INSUFFLATION 120MM (ENDOMECHANICALS) ×5 IMPLANT
PACK PERI GYN (CUSTOM PROCEDURE TRAY) ×5 IMPLANT
PAD ARMBOARD 7.5X6 YLW CONV (MISCELLANEOUS) ×5 IMPLANT
POUCH SPECIMEN RETRIEVAL 10MM (ENDOMECHANICALS) ×3 IMPLANT
SCALPEL HARMONIC ACE (MISCELLANEOUS) ×5 IMPLANT
SET BASIN LINEN APH (SET/KITS/TRAYS/PACK) ×5 IMPLANT
SET TUBE IRRIG SUCTION NO TIP (IRRIGATION / IRRIGATOR) ×3 IMPLANT
SOLUTION ANTI FOG 6CC (MISCELLANEOUS) ×5 IMPLANT
SPONGE GAUZE 2X2 8PLY STER LF (GAUZE/BANDAGES/DRESSINGS) ×3
SPONGE GAUZE 2X2 8PLY STRL LF (GAUZE/BANDAGES/DRESSINGS) ×12 IMPLANT
STAPLER VISISTAT 35W (STAPLE) ×5 IMPLANT
SUT VICRYL 0 UR6 27IN ABS (SUTURE) ×5 IMPLANT
TAPE CLOTH SURG 4X10 WHT LF (GAUZE/BANDAGES/DRESSINGS) ×3 IMPLANT
TRAY FOLEY CATH 16FR SILVER (SET/KITS/TRAYS/PACK) ×5 IMPLANT
TROCAR XCEL NON-BLD 11X100MML (ENDOMECHANICALS) ×5 IMPLANT
TROCAR XCEL NON-BLD 5MMX100MML (ENDOMECHANICALS) ×5 IMPLANT
TROCAR XCEL OPT SLVE 5M 100M (ENDOMECHANICALS) ×5 IMPLANT
TUBING HI FLO HEAT INSUFFLATOR (IRRIGATION / IRRIGATOR) ×5 IMPLANT
WARMER LAPAROSCOPE (MISCELLANEOUS) ×5 IMPLANT

## 2013-10-10 NOTE — Anesthesia Postprocedure Evaluation (Signed)
  Anesthesia Post-op Note  Patient: Crystal Rojas  Procedure(s) Performed: Procedure(s): LAPAROSCOPIC LEFT SALPINGO OOPHORECTOMY (Left) LAPAROSCOPIC LYSIS OF ADHESIONS (N/A)  Patient Location: PACU  Anesthesia Type:General  Level of Consciousness: awake, alert , oriented and patient cooperative  Airway and Oxygen Therapy: Patient Spontanous Breathing  Post-op Pain: 2 /10, mild  Post-op Assessment: Post-op Vital signs reviewed  Post-op Vital Signs: Reviewed and stable  Last Vitals:  Filed Vitals:   10/10/13 1200  BP: 135/86  Pulse: 73  Temp: 36.6 C  Resp: 15    Complications: No apparent anesthesia complications

## 2013-10-10 NOTE — H&P (Signed)
Preoperative History and Physical  Crystal Rojas is a 33 y.o. No obstetric history on file. with Patient's last menstrual period was 09/04/2012. s/p vaginal hysterectomyadmitted for a laparoscopic left salpigno oophorectomy.  Pt has developed shortly after hysterectomy dyspareunia in the4 left lower quadrant.  My evaluation has revealed an adherent left ovary to either the left pelvic sidewall or vaginal cuff or both.  As a result it will be removed laparoscopically  PMH:    Past Medical History  Diagnosis Date  . Anxiety   . Allergy   . Morbid obesity   . DDD (degenerative disc disease), lumbar   . History of migraine headaches   . Hypertension     PSH:     Past Surgical History  Procedure Laterality Date  . Cholecystectomy    . Tubal ligation    . Endometrial ablation    . Abdominal hysterectomy      POb/GynH:      OB History   Grav Para Term Preterm Abortions TAB SAB Ect Mult Living                  SH:   History  Substance Use Topics  . Smoking status: Never Smoker   . Smokeless tobacco: Never Used  . Alcohol Use: No    FH:    Family History  Problem Relation Age of Onset  . Diabetes Mother   . Hypertension Mother   . Cancer Mother     kidney  . Hypertension Father   . Diabetes Maternal Grandmother   . COPD Maternal Grandmother      Allergies: No Known Allergies  Medications:      Current facility-administered medications:bupivacaine liposome (EXPAREL) 1.3 % injection 266 mg, 20 mL, Infiltration, Once, Lazaro Arms, MD;  ceFAZolin (ANCEF) IVPB 2 g/50 mL premix, 2 g, Intravenous, On Call to OR, Lazaro Arms, MD;  lactated ringers infusion, , Intravenous, Continuous, Laurene Footman, MD, Last Rate: 75 mL/hr at 10/10/13 0940, 1,000 mL at 10/10/13 0940 midazolam (VERSED) injection 1-2 mg, 1-2 mg, Intravenous, Q5 Min x 3 PRN, Laurene Footman, MD, 2 mg at 10/10/13 0940  Review of Systems:   Review of Systems  Constitutional: Negative for fever, chills,  weight loss, malaise/fatigue and diaphoresis.  HENT: Negative for hearing loss, ear pain, nosebleeds, congestion, sore throat, neck pain, tinnitus and ear discharge.   Eyes: Negative for blurred vision, double vision, photophobia, pain, discharge and redness.  Respiratory: Negative for cough, hemoptysis, sputum production, shortness of breath, wheezing and stridor.   Cardiovascular: Negative for chest pain, palpitations, orthopnea, claudication, leg swelling and PND.  Gastrointestinal: Positive for abdominal pain. Negative for heartburn, nausea, vomiting, diarrhea, constipation, blood in stool and melena.  Genitourinary: Negative for dysuria, urgency, frequency, hematuria and flank pain.  Musculoskeletal: Negative for myalgias, back pain, joint pain and falls.  Skin: Negative for itching and rash.  Neurological: Negative for dizziness, tingling, tremors, sensory change, speech change, focal weakness, seizures, loss of consciousness, weakness and headaches.  Endo/Heme/Allergies: Negative for environmental allergies and polydipsia. Does not bruise/bleed easily.  Psychiatric/Behavioral: Negative for depression, suicidal ideas, hallucinations, memory loss and substance abuse. The patient is not nervous/anxious and does not have insomnia.      PHYSICAL EXAM:  Blood pressure 129/84, pulse 61, temperature 97.6 F (36.4 C), temperature source Oral, resp. rate 18, last menstrual period 09/04/2012, SpO2 98.00%.    Vitals reviewed. Constitutional: She is oriented to person, place, and time. She appears well-developed and well-nourished.  HENT:  Head: Normocephalic and atraumatic.  Right Ear: External ear normal.  Left Ear: External ear normal.  Nose: Nose normal.  Mouth/Throat: Oropharynx is clear and moist.  Eyes: Conjunctivae and EOM are normal. Pupils are equal, round, and reactive to light. Right eye exhibits no discharge. Left eye exhibits no discharge. No scleral icterus.  Neck: Normal range  of motion. Neck supple. No tracheal deviation present. No thyromegaly present.  Cardiovascular: Normal rate, regular rhythm, normal heart sounds and intact distal pulses.  Exam reveals no gallop and no friction rub.   No murmur heard. Respiratory: Effort normal and breath sounds normal. No respiratory distress. She has no wheezes. She has no rales. She exhibits no tenderness.  GI: Soft. Bowel sounds are normal. She exhibits no distension and no mass. There is tenderness. There is no rebound and no guarding.  Genitourinary:       Vulva is normal without lesions Vagina is pink moist without discharge Cervix normal in appearance and pap is normal Uterus is enlarged to 16-18 weeks size due to multiple fibroids the largest of which is 6.1 cm Adnexa is negative with normal sized ovaries by sonogram  Musculoskeletal: Normal range of motion. She exhibits no edema and no tenderness.  Neurological: She is alert and oriented to person, place, and time. She has normal reflexes. She displays normal reflexes. No cranial nerve deficit. She exhibits normal muscle tone. Coordination normal.  Skin: Skin is warm and dry. No rash noted. No erythema. No pallor.  Psychiatric: She has a normal mood and affect. Her behavior is normal. Judgment and thought content normal.    Labs: Results for orders placed during the hospital encounter of 10/04/13 (from the past 336 hour(s))  URINALYSIS, ROUTINE W REFLEX MICROSCOPIC   Collection Time    10/04/13  8:08 AM      Result Value Ref Range   Color, Urine YELLOW  YELLOW   APPearance CLEAR  CLEAR   Specific Gravity, Urine 1.030  1.005 - 1.030   pH 5.0  5.0 - 8.0   Glucose, UA NEGATIVE  NEGATIVE mg/dL   Hgb urine dipstick NEGATIVE  NEGATIVE   Bilirubin Urine NEGATIVE  NEGATIVE   Ketones, ur NEGATIVE  NEGATIVE mg/dL   Protein, ur NEGATIVE  NEGATIVE mg/dL   Urobilinogen, UA 1.0  0.0 - 1.0 mg/dL   Nitrite NEGATIVE  NEGATIVE   Leukocytes, UA NEGATIVE  NEGATIVE  CBC    Collection Time    10/04/13  8:15 AM      Result Value Ref Range   WBC 7.4  4.0 - 10.5 K/uL   RBC 4.92  3.87 - 5.11 MIL/uL   Hemoglobin 13.4  12.0 - 15.0 g/dL   HCT 40.9  81.1 - 91.4 %   MCV 81.5  78.0 - 100.0 fL   MCH 27.2  26.0 - 34.0 pg   MCHC 33.4  30.0 - 36.0 g/dL   RDW 78.2  95.6 - 21.3 %   Platelets 187  150 - 400 K/uL  COMPREHENSIVE METABOLIC PANEL   Collection Time    10/04/13  8:15 AM      Result Value Ref Range   Sodium 141  137 - 147 mEq/L   Potassium 4.2  3.7 - 5.3 mEq/L   Chloride 104  96 - 112 mEq/L   CO2 25  19 - 32 mEq/L   Glucose, Bld 97  70 - 99 mg/dL   BUN 13  6 - 23 mg/dL   Creatinine,  Ser 0.69  0.50 - 1.10 mg/dL   Calcium 9.3  8.4 - 16.1 mg/dL   Total Protein 7.1  6.0 - 8.3 g/dL   Albumin 3.8  3.5 - 5.2 g/dL   AST 17  0 - 37 U/L   ALT 20  0 - 35 U/L   Alkaline Phosphatase 66  39 - 117 U/L   Total Bilirubin 0.5  0.3 - 1.2 mg/dL   GFR calc non Af Amer >90  >90 mL/min   GFR calc Af Amer >90  >90 mL/min   Anion gap 12  5 - 15  TYPE AND SCREEN   Collection Time    10/04/13  8:15 AM      Result Value Ref Range   ABO/RH(D) O POS     Antibody Screen NEG     Sample Expiration 10/18/2013      EKG: No orders found for this or any previous visit.  Imaging Studies: US Transvaginal Non-ob  09/26/2013   GYNECOLOGIC SONOGRAM   Crystal Rojas is a 33 y.o.  for a pelvic sonogram for dyspareunia. S/P  hysterectomy 01/2013  Uterus                   Surgically Absent- vaginal Cuff noted (difficult  to evaluate due to meds placed vaginally causing shadowing)  Right ovary             2.9 x 2.7 x 2.1 cm,   Left ovary                4.2 x 3.4 x 3.4 cm, noted at vaginal cuff  (translabial approach used to image Lt ovary)  No free fluid or adnexal masses noted within the pelvis  Technician Comments:  Vaginal Cuff noted, Rt ovary appears WNL, Lt ovary appears @ vaginal cuff  (translabial approach used to image LT ovary) no free fluid or adnexal  masses noted    Chari Manning Sep 26, 2013 12:50 PM  Clinical Impression and recommendations:  I have reviewed the sonogram results above, combined with the patient's  current clinical course, below are my impressions and any appropriate  recommendations for management based on the sonographic findings.  Adherent left ovary to the vaginal cuff Right ovary normal  EURE,LUTHER H 2013/09/26 1:12 PM        Assessment: Dyspareunia in the left lower quadrant Adhesions of the left ovary Patient Active Problem List   Diagnosis Date Noted  . Migraine headache 07/12/2012  . Anxiety state, unspecified 07/12/2012  . Morbid obesity 07/12/2012    Plan: Laparoscopic removal of left tube and ovary    EURE,LUTHER H 10/10/2013 9:59 AM

## 2013-10-10 NOTE — Anesthesia Procedure Notes (Signed)
Procedure Name: Intubation Date/Time: 10/10/2013 10:51 AM Performed by: Despina Hidden Pre-anesthesia Checklist: Patient identified, Emergency Drugs available, Suction available and Patient being monitored Patient Re-evaluated:Patient Re-evaluated prior to inductionOxygen Delivery Method: Circle system utilized Preoxygenation: Pre-oxygenation with 100% oxygen Intubation Type: IV induction Ventilation: Mask ventilation without difficulty and Oral airway inserted - appropriate to patient size Laryngoscope Size: Mac and 3 Grade View: Grade I Tube type: Oral Tube size: 7.0 mm Number of attempts: 1 Airway Equipment and Method: Stylet Placement Confirmation: ETT inserted through vocal cords under direct vision,  positive ETCO2 and breath sounds checked- equal and bilateral Secured at: 22 cm Tube secured with: Tape Dental Injury: Teeth and Oropharynx as per pre-operative assessment

## 2013-10-10 NOTE — Anesthesia Preprocedure Evaluation (Signed)
Anesthesia Evaluation  Patient identified by MRN, date of birth, ID band Patient awake    Reviewed: Allergy & Precautions, H&P , NPO status , Patient's Chart, lab work & pertinent test results  Airway Mallampati: II TM Distance: >3 FB     Dental  (+) Teeth Intact   Pulmonary neg pulmonary ROS,  breath sounds clear to auscultation        Cardiovascular hypertension, Pt. on medications Rhythm:Regular Rate:Normal     Neuro/Psych  Headaches, PSYCHIATRIC DISORDERS Anxiety    GI/Hepatic negative GI ROS,   Endo/Other  Morbid obesity  Renal/GU      Musculoskeletal   Abdominal   Peds  Hematology   Anesthesia Other Findings   Reproductive/Obstetrics                           Anesthesia Physical Anesthesia Plan  ASA: II  Anesthesia Plan: General   Post-op Pain Management:    Induction: Intravenous  Airway Management Planned: Oral ETT  Additional Equipment:   Intra-op Plan:   Post-operative Plan: Extubation in OR  Informed Consent: I have reviewed the patients History and Physical, chart, labs and discussed the procedure including the risks, benefits and alternatives for the proposed anesthesia with the patient or authorized representative who has indicated his/her understanding and acceptance.     Plan Discussed with:   Anesthesia Plan Comments:         Anesthesia Quick Evaluation

## 2013-10-10 NOTE — Discharge Instructions (Signed)
Laparoscopic Ovarian Torsion Surgery °The ovaries are female reproductive organs that produce eggs. Ovarian torsion is when an ovary becomes twisted and cuts off its own blood supply. If the ovary becomes twisted, it cannot get blood and the ovary swells. This swelling can cause extreme pelvic pain that can come and go. Laparoscopic ovarian torsion surgery is needed to untwist the ovary and restore blood flow to the ovary.  °LET YOUR CAREGIVER KNOW ABOUT:  °· Allergies to food or medicine. °· Medicines taken, including vitamins, herbs, eyedrops, over-the-counter medicines, and creams. °· Use of steroids (by mouth or creams). °· Previous problems with anesthetics or numbing medicines. °· History of bleeding problems or blood clots. °· Previous surgery. °· Other health problems, including hypertension, diabetes, and kidney problems. °· Possibility of pregnancy. °RISKS AND COMPLICATIONS °· Allergies to medicines. °· Difficulty breathing. °· Bleeding. °· Infection. °· Damage to other structures near the ovaries. °BEFORE THE PROCEDURE °· If possible, ask your caregiver about changing or stopping your regular medicines. °· If possible, do not eat or drink anything for at least 8 hours before your surgery. °· If possible, stop smoking (if you smoke). Stopping will improve your health after surgery. °· Arrange for a ride home after surgery and for help at home during recovery. °PROCEDURE °· An intravenous (IV) access tube will be put into one of your vein in order to give you fluids and medicines. °· You will receive medicines to relax you and medicines that make you sleep (general anesthetic). °· You may have a flexible tube (catheter) put into your bladder to drain urine. °· You may have a tube put through your nose or mouth into your stomach (nasogastric tube). The nasogastric tube removes digestive fluids and prevents you from feeling sick to your stomach (nauseous) and throwing up (vomiting). °· Three to four small  incisions (laparoscopically) or an open incision will be made in the abdomen. Your surgeon will decide the appropriate approach. °· The ovary will be untwisted with small instruments inserted through the laparoscopic incisions. °· The ovary is then observed to see if blood flow returns. If blood flow cannot be restored to the ovary, it may have to be surgically removed. °AFTER THE PROCEDURE  °· Plan to be in the hospital for 1 day or less. °· You may have abdominal cramping and a sore throat. Your pain will be controlled with medicine. °· You may have a liquid diet temporarily. You will most likely return to, and tolerate, your usual diet the day after surgery. °· You will be passing urine through a catheter. The catheter will be removed after surgery. °· Your temperature, breathing rate, heart rate, blood pressure, and oxygen level will be monitored regularly. °· You will still wear compression stockings on your legs until you are able to move around. °· You will use a device or do breathing exercises to keep your lungs clear. °· You will be encouraged to walk as soon as possible. °· Expect a full recovery in 4 to 6 weeks after surgery. °Document Released: 04/26/2011 Document Reviewed: 04/26/2011 °Exit[employer]® Patient Information ©2015 [employer]. This information is not intended to replace advice given to you by your health care provider. Make sure you discuss any questions you have with your health care provider. ° °

## 2013-10-10 NOTE — Op Note (Signed)
Preoperative diagnosis:  1.  Dyspareunia secondary to left ovary adherent to the vagina and left pelvic sidewall                                         2.  Bowel adhesions to the vaginal cuff and the bladder  Postoperative diagnosis:  Same as above  Procedure:  Laparoscopic left salpingo oophorectomy and lysis of adhesions  Surgeon:  Rockne Coons MD  Anesthesia:  Gen. Endotracheal  Findings:  Yhe patient noted development of left sided dyspareunia not long after her vaginal hysterectomy last year.  I evaluated her and found the left ovary was adherent to the left pelvic sidewall and the vaginal apex.  Description of operation:  The patient was taken to the operating room and placed in the supine position where she underwent general endotracheal anesthesia.  She was placed in the low lithotomy position.  She was prepped and draped in the usual sterile fashion and a Foley catheter was placed.  An incision was made in the umbilicus and a Veres needle was placed into the peritoneal cavity with one pass without difficulty.  The peritoneal cavity was then insufflated.  An 11 mm non-bladed direct visualization trocar was then used and placed into the peritoneal cavity with direct laparoscopic visualization again without difficulty.  Incisions were then made in the left lower quadrant and right lower quadrant.  A 5 mm trocar was placed in the left lower quadrant and a 5 mm trocar was placed in the right lower quadrant.  Both were done under direct visualization without difficulty. Adhesions were noted of the bowel to the vaginal cuff and the bladder.  These adhesions were taken down with the Harmonic scalpel. The left ovary was grasped.  The harmonic scalpel was used and the infundibulopelvic ligament on the left was coagulated and then transected. The left ovary was removed from the abdomen and sent to pathology, it appeared normal just adherent to the sidewall and vagina The subcutaneous tissue was then  reapproximated with 0 Vicryl. The skin was closed using skin staples.  The umbilical fascia was closed with a single 0 Vicryl suture.  The subcutaneous tissue was reapproximated loosely using 0 Vicryl.  The skin was closed using skin staples.  The patient was awakened from anesthesia and taken to the recovery room in good stable condition with all counts being correct x3.    The patient received Ancef preoperatively.  She also received Toradol IV preoperatively.  She will be discharged from the recovery room time and seen next week for staple and suture removal.  Layan Zalenski H 10/10/2013 11:57 AM

## 2013-10-10 NOTE — Transfer of Care (Signed)
Immediate Anesthesia Transfer of Care Note  Patient: Crystal Rojas  Procedure(s) Performed: Procedure(s): LAPAROSCOPIC LEFT SALPINGO OOPHORECTOMY (Left) LAPAROSCOPIC LYSIS OF ADHESIONS (N/A)  Patient Location: PACU  Anesthesia Type:General  Level of Consciousness: awake and patient cooperative  Airway & Oxygen Therapy: Patient Spontanous Breathing and Patient connected to face mask oxygen  Post-op Assessment: Report given to PACU RN, Post -op Vital signs reviewed and stable and Patient moving all extremities  Post vital signs: Reviewed and stable  Complications: No apparent anesthesia complications

## 2013-10-11 ENCOUNTER — Encounter (HOSPITAL_COMMUNITY): Payer: Self-pay | Admitting: Obstetrics & Gynecology

## 2013-10-11 ENCOUNTER — Telehealth: Payer: Self-pay | Admitting: Obstetrics & Gynecology

## 2013-10-11 NOTE — Telephone Encounter (Signed)
Pt states had surgery on 10/10/2013 was told by Dr. Despina Hidden could go back to work on 10/15/2013 however her post op appt is scheduled for 10/18/2013. Per Dr. Despina Hidden pt is able to return to work on 10/15/2013 before her post op appt. Pt verbalized understanding.

## 2013-10-15 ENCOUNTER — Telehealth: Payer: Self-pay | Admitting: Obstetrics & Gynecology

## 2013-10-15 NOTE — Telephone Encounter (Signed)
Pt c/o "tingling sensation" at incision site, no drainage, no redness, no swelling or heat at incision site, no fever. Pt encouraged to call office back if fever, redness, drainage or swelling occurs and to keep her appt scheduled for 10/18/2013.  Pt verbalized understanding.

## 2013-10-16 ENCOUNTER — Encounter: Payer: Self-pay | Admitting: Obstetrics & Gynecology

## 2013-10-16 ENCOUNTER — Ambulatory Visit (INDEPENDENT_AMBULATORY_CARE_PROVIDER_SITE_OTHER): Payer: Private Health Insurance - Indemnity | Admitting: Obstetrics & Gynecology

## 2013-10-16 VITALS — BP 140/84 | Ht 63.0 in | Wt 255.0 lb

## 2013-10-16 DIAGNOSIS — Z9889 Other specified postprocedural states: Secondary | ICD-10-CM

## 2013-10-16 NOTE — Progress Notes (Signed)
Patient ID: Crystal Rojas, female   DOB: Feb 03, 1981, 33 y.o.   MRN: [id number] 1 week post op from laparoscopic removl of left tube and ovary as well as  lysisi of adhesions  Incisions are a little red from reaction to the staples  GV placed  Some bruising  Follow up prn

## 2013-10-17 ENCOUNTER — Encounter: Payer: Private Health Insurance - Indemnity | Admitting: Obstetrics & Gynecology

## 2013-10-18 ENCOUNTER — Encounter: Payer: Private Health Insurance - Indemnity | Admitting: Obstetrics & Gynecology

## 2013-10-25 DIAGNOSIS — Z029 Encounter for administrative examinations, unspecified: Secondary | ICD-10-CM

## 2014-06-12 ENCOUNTER — Other Ambulatory Visit (HOSPITAL_COMMUNITY): Payer: Managed Care, Other (non HMO) | Attending: Psychiatry | Admitting: Psychiatry

## 2014-06-12 ENCOUNTER — Encounter (INDEPENDENT_AMBULATORY_CARE_PROVIDER_SITE_OTHER): Payer: Self-pay

## 2014-06-12 ENCOUNTER — Encounter (HOSPITAL_COMMUNITY): Payer: Self-pay | Admitting: Psychiatry

## 2014-06-12 DIAGNOSIS — F411 Generalized anxiety disorder: Secondary | ICD-10-CM | POA: Diagnosis not present

## 2014-06-12 DIAGNOSIS — F331 Major depressive disorder, recurrent, moderate: Secondary | ICD-10-CM | POA: Diagnosis not present
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
## 2014-06-12 NOTE — Progress Notes (Signed)
    Daily Group Progress Note  Program: IOP  Group Time: 9:00-10:30  Participation Level: Active  Behavioral Response: Appropriate  Type of Therapy:  Group Therapy  Summary of Progress: Pt. Met with with case manager and psychiatrist.     Group Time: 10:30-12:00  Participation Level:  Active  Behavioral Response: Appropriate  Type of Therapy: Psycho-education Group  Summary of Progress: Pt. Smiled appropriately. Pt. Watched inside and out and participated in discussion about the purpose of feelings.   Nancie Neas, LPC
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
## 2014-06-13 ENCOUNTER — Other Ambulatory Visit (HOSPITAL_COMMUNITY): Payer: Managed Care, Other (non HMO) | Attending: Psychiatry | Admitting: Psychiatry

## 2014-06-13 DIAGNOSIS — F331 Major depressive disorder, recurrent, moderate: Secondary | ICD-10-CM | POA: Diagnosis not present

## 2014-06-13 NOTE — Progress Notes (Signed)
    Daily Group Progress Note  Program: IOP  Group Time: 0900-1045  Participation Level: Active  Behavioral Response: Appropriate and Sharing  Type of Therapy:  Group Therapy  Summary of Progress:  Today was patient's official full day in the groups.  Pt was very open as to how her depression and anxiety is affecting her daily living.  Reports her main stressor is her 259 yr old son's illness and limitations.  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  "The stress trickles down to my marriage, parenting, and finances."  Pt describes herself as being a tree trunk.  "I am a fixture that is just there...don't feel...just there."     Group Time: 1100-1200  Participation Level:  Active  Behavioral Response: Appropriate and Sharing  Type of Therapy: Psycho-education Group  Summary of Progress: Processed the movie that was watched yesterday (Inside-Out).  Activity had two parts.  The first part the various emotions were defined and the second part the patient had to list times he/she felt certain emotions.   Jeri Modenaita Clark, M.Ed

## 2014-06-14 ENCOUNTER — Other Ambulatory Visit (HOSPITAL_COMMUNITY): Payer: Managed Care, Other (non HMO) | Admitting: Psychiatry

## 2014-06-14 DIAGNOSIS — F331 Major depressive disorder, recurrent, moderate: Secondary | ICD-10-CM | POA: Diagnosis not present

## 2014-06-14 NOTE — Progress Notes (Signed)
    Daily Group Progress Note  Program: IOP  Group Time: 0900-1045  Participation Level: Active  Behavioral Response: Appropriate and Sharing  Type of Therapy:  Group Therapy  Summary of Progress: Patient reports she went shopping yesterday.  States her new hobby is making mesh wreaths.  Pt showed peers a picture of one she made.  "It feels great to take time out for me and do things I want to do.  Mentioned she has a new 237 wk old boxer, in which she is enjoying.  Pt received phone call from husband this morning.  He stated that he is in the hospital in [place] due to kidney stones.  "I am worried, but I know there's nothing I can do for him."     Group Time: 1100-1200  Participation Level:  Active  Behavioral Response: Appropriate and Sharing  Type of Therapy: Psycho-education Group  Summary of Progress: Patient participated in discussing how grief/loss has made an impact on her life.   Jeri Modenaita Davine Coba, M.Ed

## 2014-06-17 ENCOUNTER — Other Ambulatory Visit (HOSPITAL_COMMUNITY): Payer: Managed Care, Other (non HMO) | Attending: Psychiatry | Admitting: Psychiatry

## 2014-06-17 DIAGNOSIS — F331 Major depressive disorder, recurrent, moderate: Secondary | ICD-10-CM | POA: Diagnosis not present

## 2014-06-17 DIAGNOSIS — F411 Generalized anxiety disorder: Secondary | ICD-10-CM

## 2014-06-17 NOTE — Progress Notes (Signed)
    Daily Group Progress Note  Program: IOP  Group Time: 9:00-10:30  Participation Level: Active  Behavioral Response: Appropriate  Type of Therapy:  Psycho-education Group  Summary of Progress: Pt. Participated in medication management group with Michelle NasutiElena.     Group Time: 10:30-12:00  Participation Level:  Active  Behavioral Response: Appropriate  Type of Therapy: Group Therapy  Summary of Progress: Pt. Smiled and talked appropriately, makes appropriate eye contact with group members. Pt. Discussed pain of job dissatisfaction and loss associated with her child's vision impairment and anticipation of total loss of eyesight.   Shaune PollackBrown, Yann Biehn B, LPC

## 2014-06-18 ENCOUNTER — Other Ambulatory Visit (HOSPITAL_COMMUNITY): Payer: Managed Care, Other (non HMO) | Admitting: Psychiatry

## 2014-06-18 DIAGNOSIS — F411 Generalized anxiety disorder: Secondary | ICD-10-CM

## 2014-06-18 NOTE — Progress Notes (Signed)
    Daily Group Progress Note  Program: IOP  Group Time: 0900-1045  Participation Level: Active  Behavioral Response: Appropriate and Sharing  Type of Therapy:  Group Therapy  Summary of Progress:   Pt voiced that she was feeling very down yesterday.  States she went straight home and stayed most of her time in bed.  Reported no apparent trigger.  Praised patient for getting up, getting herself dressed, and driving to attend [facility]-I[facility] today.     Group Time: 1100-1200  Participation Level:  Active  Behavioral Response: Appropriate and Sharing  Type of Therapy: Psycho-education Group  Summary of Progress:  Alyce came from [facility] to educate patients about the groups they offer; which provide ways to maintain wellness, coping with stress and how to manage their symptoms.    Jeri Modenaita Clark, M.Ed

## 2014-06-19 ENCOUNTER — Other Ambulatory Visit (HOSPITAL_COMMUNITY): Payer: Managed Care, Other (non HMO) | Admitting: Psychiatry

## 2014-06-19 DIAGNOSIS — F411 Generalized anxiety disorder: Secondary | ICD-10-CM | POA: Diagnosis not present

## 2014-06-19 MED ORDER — GABAPENTIN 300 MG PO CAPS: 300.0000 mg | ORAL_CAPSULE | Freq: Three times a day (TID) | ORAL | Status: DC

## 2014-06-19 NOTE — Progress Notes (Signed)
    Daily Group Progress Note  Program: IOP  Group Time: 9:00-10:30  Participation Level: Active  Behavioral Response: Appropriate  Type of Therapy:  Group Therapy  Summary of Progress: Pt. Presented with good eye contact, talked appropriately. Pt. Continues to process challenge of work environment, managing work stress, history of caffeine addiction and migraine headaches.      Group Time: 10:30-12:00  Participation Level:  Active  Behavioral Response: Appropriate  Type of Therapy: Psycho-education Group  Summary of Progress: Pt. Participated in discussion about addictive behaviors and replacement with healthy coping behaviors.   Shaune PollackBrown, Jennifer B, LPC

## 2014-06-20 ENCOUNTER — Other Ambulatory Visit (HOSPITAL_COMMUNITY): Payer: Managed Care, Other (non HMO) | Admitting: Psychiatry

## 2014-06-20 DIAGNOSIS — F411 Generalized anxiety disorder: Secondary | ICD-10-CM | POA: Diagnosis not present

## 2014-06-21 ENCOUNTER — Telehealth (HOSPITAL_COMMUNITY): Payer: Self-pay | Admitting: Psychiatry

## 2014-06-21 ENCOUNTER — Other Ambulatory Visit (HOSPITAL_COMMUNITY): Payer: Managed Care, Other (non HMO)

## 2014-06-21 NOTE — Progress Notes (Signed)
    Daily Group Progress Note  Program: IOP  Group Time: 9:00-10:30  Participation Level: Active  Behavioral Response: Appropriate  Type of Therapy:  Group Therapy  Summary of Progress: Pt. Talked in group, made appropriate eye contact. Pt. Continues to report depressed mood with most significant trigger being job dissatisfaction and son's sight impairment. Pt. Reports feeling "stuck" in current job and belief that she cannot find a job that is comparable in pay and benefits.      Group Time: 10:30-12:00  Participation Level:  Active  Behavioral Response: Appropriate  Type of Therapy: Psycho-education Group  Summary of Progress: Pt. Participated in discussion of RAIN method of meditation and importance to developing emotional regulation skills.   Shaune PollackBrown, Jennifer B, LPC

## 2014-06-24 ENCOUNTER — Other Ambulatory Visit (HOSPITAL_COMMUNITY): Payer: Managed Care, Other (non HMO) | Admitting: Psychiatry

## 2014-06-25 ENCOUNTER — Other Ambulatory Visit (HOSPITAL_COMMUNITY): Payer: Managed Care, Other (non HMO) | Admitting: Psychiatry

## 2014-06-25 DIAGNOSIS — F411 Generalized anxiety disorder: Secondary | ICD-10-CM | POA: Diagnosis not present

## 2014-06-25 NOTE — Progress Notes (Signed)
    Daily Group Progress Note  Program: IOP  Group Time: 1100-1200  Participation Level: Active  Behavioral Response: Appropriate and Sharing  Type of Therapy:  Psycho-education Group  Summary of Progress: Patient participated in various movement exercises. Discussed ten relaxation techniques that will help with stress. Also, listened to a CD and applied focused breathing and progressive relaxation.                 

## 2014-06-26 ENCOUNTER — Other Ambulatory Visit (HOSPITAL_COMMUNITY): Payer: Managed Care, Other (non HMO) | Admitting: Psychiatry

## 2014-06-26 DIAGNOSIS — F411 Generalized anxiety disorder: Secondary | ICD-10-CM

## 2014-06-26 NOTE — Progress Notes (Signed)
    Daily Group Progress Note  Program: IOP  Group Time: 9:00-10:30  Participation Level: Active  Behavioral Response: Appropriate  Type of Therapy:  Group Therapy  Summary of Progress: Pt. Presented with brightened affect, participates in group, appropriate tearfulness. Pt. Discussed fears about returning to work, worries about being anxious and depressed in the presence of her children, and fear's about son's illness.      Group Time: 10:30-12:00  Participation Level:  Active  Behavioral Response: Appropriate  Type of Therapy: Psycho-education Group  Summary of Progress: Pt. Watched Principal FinancialBrene Brown videos on blame, boundaries, and developing vulnerability. Pt. Participated in discussion about developing healthy relationship boundaries.   Shaune PollackBrown, Jennifer B, LPC

## 2014-06-27 ENCOUNTER — Other Ambulatory Visit (HOSPITAL_COMMUNITY): Payer: Managed Care, Other (non HMO) | Admitting: Psychiatry

## 2014-06-27 DIAGNOSIS — F411 Generalized anxiety disorder: Secondary | ICD-10-CM

## 2014-06-28 ENCOUNTER — Other Ambulatory Visit (HOSPITAL_COMMUNITY): Payer: Managed Care, Other (non HMO) | Admitting: Psychiatry

## 2014-06-28 DIAGNOSIS — F411 Generalized anxiety disorder: Secondary | ICD-10-CM

## 2014-06-28 NOTE — Progress Notes (Signed)
    Daily Group Progress Note  Program: IOP  Group Time: 9:00-10:30  Participation Level: Active  Behavioral Response: Appropriate  Type of Therapy:  Group Therapy  Summary of Progress: Pt. Reported that she was "ok". Pt. Expressed sadness and grief related to son's eye disorder and anticipation of his blindness. Pt. Expressed sadness related to absence of support from biological relatives for son's illness, while extended family have provided support. Pt. Expressed happiness [place] and efforts she made to raise money with [employer]Fund[employer] to benefit her son.      Group Time: 10:30-12:00  Participation Level:  Active  Behavioral Response: Appropriate  Type of Therapy: Psycho-education Group  Summary of Progress: Pt. Participated in discussion about development of self-compassion. Pt. Was attentive during discussion of Clovia CuffKristin Neff video about development of self-compassion through mindfulness, kindness to self, and recognition of common humanity.   Shaune PollackBrown, Ulysees Robarts B, LPC

## 2014-06-28 NOTE — Progress Notes (Signed)
    Daily Group Progress Note  Program: IOP  Group Time: 9:00-10:30  Participation Level: Active  Behavioral Response: Appropriate  Type of Therapy:  Psycho-education Group  Summary of Progress: Pt. Participated in instruction about use of meditation for emotion regulation and breath focused meditation exercise.      Group Time: 10:30-12:00  Participation Level:  Active  Behavioral Response: Appropriate  Type of Therapy: Group Therapy  Summary of Progress: Pt. Presents as alert, talkative, appropriately tearful related to grief regarding son's illness and marital tension. Pt. Expressed concern about her husband's mood and how his treatment of her reinforces feelings of inadequacy.  Shaune PollackBrown, Jennifer B, LPC

## 2014-07-01 ENCOUNTER — Other Ambulatory Visit (HOSPITAL_COMMUNITY): Payer: Managed Care, Other (non HMO) | Admitting: Psychiatry

## 2014-07-01 DIAGNOSIS — F411 Generalized anxiety disorder: Secondary | ICD-10-CM | POA: Diagnosis not present

## 2014-07-01 NOTE — Progress Notes (Signed)
    Daily Group Progress Note  Program: IOP  Group Time: 9:00-10:30  Participation Level: Active  Behavioral Response: Appropriate  Type of Therapy:  Psycho-education Group  Summary of Progress: Pt. Participated in medication education group facilitated by [facility].     Group Time: 10:30-12:00  Participation Level:  Active  Behavioral Response: Appropriate  Type of Therapy: Group Therapy  Summary of Progress: Pt. Reported that she feels "ok" and "overwhelmed". Pt. [place] and overwhelmed by trip planning and packing. Pt. Reported that she was able to relax in the grass and enjoy her dog and family over the weekend.   Shaune PollackBrown, Dailah Opperman B, LPC

## 2014-07-02 ENCOUNTER — Other Ambulatory Visit (HOSPITAL_COMMUNITY): Payer: Managed Care, Other (non HMO) | Admitting: Psychiatry

## 2014-07-02 DIAGNOSIS — F411 Generalized anxiety disorder: Secondary | ICD-10-CM | POA: Diagnosis not present

## 2014-07-02 NOTE — Progress Notes (Signed)
    Daily Group Progress Note  Program: IOP  Group Time: 0900-1045  Participation Level: Active  Behavioral Response: Appropriate  Type of Therapy:  Group Therapy  Summary of Progress: Patient was very active in the group; even provided positive feedback to peers.  Patient voiced that she has the desire to return to school.  States she is interested in nursing school.  "I want to be in a helping field, but I know I have the tendency to get attached easily."     Group Time: 1100-1200  Participation Level:  Active  Behavioral Response: Appropriate  Type of Therapy: Psycho-education Group  Summary of Progress: [facility] provided patient with information about mental health services and educated patient about maintaining wellness, coping with stress and managing symptoms.

## 2014-07-03 ENCOUNTER — Other Ambulatory Visit (HOSPITAL_COMMUNITY): Payer: Managed Care, Other (non HMO) | Admitting: Psychiatry

## 2014-07-03 DIAGNOSIS — F411 Generalized anxiety disorder: Secondary | ICD-10-CM | POA: Diagnosis not present

## 2014-07-03 NOTE — Progress Notes (Signed)
    Daily Group Progress Note  Program: IOP  Group Time: 9:00-10:30  Participation Level: Active  Behavioral Response: Appropriate  Type of Therapy:  Psycho-education Group  Summary of Progress:  Pt. Participated in discussion about development of self-care behaviors. Pt. Discussed sheet "100 ways to self-care".     Group Time: 10:30-12:00  Participation Level:  Active  Behavioral Response: Appropriate  Type of Therapy: Group Therapy  Summary of Progress: Pt. Continues to report most significant stressors are her work, child's illness. Pt. Also discusses concerns about husband's mood changes and pattern of being controlling. Pt. Identified with group theme of parental abandonment.   [facility]

## 2014-07-04 ENCOUNTER — Ambulatory Visit (HOSPITAL_COMMUNITY): Payer: Managed Care, Other (non HMO) | Admitting: Psychiatry

## 2014-07-04 DIAGNOSIS — F411 Generalized anxiety disorder: Secondary | ICD-10-CM | POA: Diagnosis not present

## 2014-07-04 NOTE — Progress Notes (Signed)
Patient ID: Emelia Lorononya M Stith, female   DOB: 1980-03-04, 34 y.o.   MRN: [id number] Discharge Note  Patient:  Emelia Lorononya M Quickel is an 34 y.o., female DOB:  1980-03-04  Date of Admission:  06/20/2014  Date of Discharge:  07/04/2014  Reason for Admission:anxiety  IOP Course:  Ms Julien Girterkins attended and participated.  Initially the group was small with 2 other anxious patients and that was particularly helpful to her she said.  Since then it has been helpful to talk but she has not gotten the specific skills training she needs.  Reminded that individual outpatient  Mental Status at Discharge:depressed less.  No suicidal thoughts.  Anxiety less and currently under control  Lab Results: No results found for this or any previous visit (from the past 48 hour(s)).   Current outpatient prescriptions:  .  gabapentin (NEURONTIN) 300 MG capsule, Take 1 capsule (300 mg total) by mouth 3 (three) times daily., Disp: 90 capsule, Rfl: 2 .  ketorolac (TORADOL) 10 MG tablet, Take 1 tablet (10 mg total) by mouth every 8 (eight) hours as needed., Disp: 15 tablet, Rfl: 0 .  MYRBETRIQ 50 MG TB24 tablet, Take 50 mg by mouth daily. , Disp: , Rfl:  .  ondansetron (ZOFRAN) 8 MG tablet, Take 1 tablet (8 mg total) by mouth every 8 (eight) hours as needed for nausea., Disp: 12 tablet, Rfl: 0 .  oxyCODONE-acetaminophen (PERCOCET) 7.5-325 MG per tablet, Take 1-2 tablets by mouth every 6 (six) hours as needed for pain., Disp: 30 tablet, Rfl: 0 .  valsartan (DIOVAN) 80 MG tablet, Take 80 mg by mouth daily., Disp: , Rfl:   Axis Diagnosis:  Generalized anxiety disorder   Level of Care:  IOP  Discharge destination:  Other:  follow up with psychiatrist and therapist.  Is patient on multiple antipsychotic therapies at discharge:  No    Has Patient had three or more failed trials of antipsychotic monotherapy by history:  Negative  Patient phone:  [phone] (home)  Patient address[place]   [address] [place]  [id number],   Follow-up recommendations:  Activity:  continue current activity Diet:  continue current diet  Comments:  Plans to follow up with the [facility] skills training group  The patient received suicide prevention pamphlet:  Yes Belongings returned:  na  Benjaman PottAYLOR,Arrielle Mcginn D 07/04/2014, 11:26 AM

## 2014-07-04 NOTE — Patient Instructions (Signed)
Patient will be discharging tomorrow (07-05-14).  Will follow up with Dr. Michae KavaAgarwal on 07-18-14 @ 1:15 pm and Forde RadonLeAnne Yates, LPC on 07-10-14 @ 1:30 pm.  Encouraged support groups.

## 2014-07-05 ENCOUNTER — Other Ambulatory Visit (HOSPITAL_COMMUNITY): Payer: Managed Care, Other (non HMO) | Admitting: Psychiatry

## 2014-07-05 DIAGNOSIS — F411 Generalized anxiety disorder: Secondary | ICD-10-CM

## 2014-07-05 NOTE — Psych (Signed)
Crystal Rojas is a 34 y.o. ,married, employed, Caucasian female who was referred per a previous patient. Patient stated she is here due to anxiety and depressive symptoms. Symptoms included: Sadness, poor concentration, crying spells, no energy, low motivation, ruminating thoughts, poor self esteem, indecisiveness, irritability, anhedonia, poor sleep. Denied SI/HI or A/V hallucinations. Triggers/Stressors: 1) In October 2015, 32 yr old son was diagnosed with a condition( that is causing progressive blindness with a very uncertain course as to how fast it will progress. She has not been able to find any agency or group that helps a person who is going blind and that is very frustrating for her. Pt stated [facility]. Pt has had to take a lot of time off from work to transport him. "Whenever I go to the appointments every month, it just stirs everything up again." Reported son was suspended recently due to hitting someone who pulled on his shirt. 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 Reported a lot of changes there. She is gone from home, including the commute from 7 am to 7 pm. Job performance decreased. Stated she's not meeting her quota and has had a verbal warning. 3) Single parenting during the week due to husband working out of town. Although he is out of town a lot; pt stated he is her support system. "He keeps his feelings bottled up." She has always had anxiety but has been able to manage it. Now she is anxious all the time with chest pain that will not let up even with medication.  Denied any previous psychiatric admissions. Has previously seen a therapist due to depression and marital issues. Denied any previous suicide attempts. Medical Issues: HTN, hx of migraines. C/O lower back issues. Denied hx of seizures. PCP:  Dr. Dwana [place]ack Hall in [place]. No allergies. Family Hx: Father (Depression).  Pt completed MH-IOP today.  Reports decreased anxiety and depression.  Reports that the program helped with providing structure and giving her an opportunity to share things she hasn't shared before.  Pt is interested in attending groups at [facility] next.  Denies SI/HI or A/V hallucinations.  A:  D/C today.  F/U with Dr. [name] on 07-18-14 @ 1:15 pm and Crystal Rojas, LPC on 07-19-14.  Encouraged support groups.  R:  Pt receptive.

## 2014-07-05 NOTE — Psych (Signed)
    Daily Group Progress Note  Program: IOP  Group Time: 9:00-10:30  Participation Level: Active  Behavioral Response: Appropriate  Type of Therapy:  Group Therapy  Summary of Progress: Pt. Discussed pain of being misunderstood and unsupportive by her mother. Pt. Discussed feeling helpless regarding son who is losing his eyesight. Pt. Talked and smiled appropriately.     Group Time: 10:30-12:00  Participation Level:  Active  Behavioral Response: Appropriate  Type of Therapy: Psycho-education Group  Summary of Progress: Pt. Participated in reading and meditation about self-acceptance.   Shaune PollackBrown, Jennifer B, LPC

## 2014-07-08 NOTE — Psych (Signed)
    Daily Group Progress Note  Program: IOP  Group Time: 9:00-10:30  Participation Level: Active  Behavioral Response: Appropriate  Type of Therapy:  Psycho-education Group  Summary of Progress: Pt. Watched and discussed Dewain PenningBrene Tessy Pawelski video on developing vulnerability for shame resilience.     Group Time: 10:30-12:00  Participation Level:  Active  Behavioral Response: Appropriate  Type of Therapy: Group Therapy  Summary of Progress: Pt. Prepared for discharge. Pt. Reported feeling overwhelmed due to planning for [place] trip over the weekend, planning for banquet and daughter's formal. Pt. Laughed and smiled appropriately and setting career exploration and counseling as a goal for ongoing counseling.   Shaune PollackBrown, Honor Fairbank B, LPC

## 2014-07-10 ENCOUNTER — Ambulatory Visit (HOSPITAL_COMMUNITY): Payer: Self-pay | Admitting: Psychology

## 2014-07-18 ENCOUNTER — Encounter (HOSPITAL_COMMUNITY): Payer: Self-pay | Admitting: Psychiatry

## 2014-07-18 ENCOUNTER — Ambulatory Visit (INDEPENDENT_AMBULATORY_CARE_PROVIDER_SITE_OTHER): Payer: Managed Care, Other (non HMO) | Admitting: Psychiatry

## 2014-07-18 VITALS — BP 142/98 | HR 89 | Ht 63.0 in | Wt 244.0 lb

## 2014-07-18 DIAGNOSIS — F411 Generalized anxiety disorder: Secondary | ICD-10-CM

## 2014-07-18 DIAGNOSIS — F339 Major depressive disorder, recurrent, unspecified: Secondary | ICD-10-CM

## 2014-07-18 DIAGNOSIS — F331 Major depressive disorder, recurrent, moderate: Secondary | ICD-10-CM

## 2014-07-18 MED ORDER — LORAZEPAM 0.5 MG PO TABS: 0.5000 mg | ORAL_TABLET | Freq: Three times a day (TID) | ORAL | Status: DC | PRN

## 2014-07-18 NOTE — Progress Notes (Signed)
Psychiatric Initial Adult Assessment   Patient Identification: SIA GABRIELSEN MRN:  [id number] Date of Evaluation:  07/18/2014 Referral Source: IOP Chief Complaint:anxiety and depression   Visit Diagnosis: major depression, recurrent, moderate. Generalized anxiety disorder Diagnosis:   Patient Active Problem List   Diagnosis Date Noted  . Generalized anxiety disorder [F41.1] 06/12/2014    Priority: High    Class: Chronic  . Depression, major, recurrent, moderate [F33.1] 06/12/2014    Priority: Medium    Class: Chronic  . Migraine headache [G43.909] 07/12/2012  . Anxiety state, unspecified [F41.1] 07/12/2012  . Morbid obesity [E66.01] 07/12/2012   History of Present Illness:  Ms Bumpus is known to me as I assessed her in the IOP program.  She is stressed out with her job (responsibilities increased with more micro management and fear of being fired if quotas are not met), marriage ( husband is 18 years older and very controlling calling 4-5 times daily to check whether the children had a bath , got to bed on time etc.  He also has an extreme temper so that she and the 3 children are walking on egg shells all the time to not upset him) and the fact that her son has a disorder that is causing him to go blind.  She has gotten depressed to the point of sleeping too much and cannot get out of the bed, crying, no energy interest or focus, not enjoying anything, increased irritability and mood swings.  No suicidal thoughts.  Anxiety is daily and she cannot stop worrying about anything and everything.  The clonazepam 0.5 mg is helpful but she does not take it much because it makes her tired.  The Cymbalta may be helping some.  The gabapentin is not doing anything she can tell. Elements:  Location:  depression and anxiety. Quality:  cannot work or relax. Severity:  not suicidal but is interfering with job, marriage and ability to parent. Timing:  son's blindness was the last straw. Duration:  a year  at least. Context:  as above. Associated Signs/Symptoms: Depression Symptoms:  depressed mood, anhedonia, hypersomnia, fatigue, difficulty concentrating, anxiety, (Hypo) Manic Symptoms:  none Anxiety Symptoms:  Excessive Worry, Psychotic Symptoms:  none PTSD Symptoms: Negative  Past Medical History:  Past Medical History  Diagnosis Date  . Anxiety   . Allergy   . Morbid obesity   . DDD (degenerative disc disease), lumbar   . History of migraine headaches   . Hypertension     Past Surgical History  Procedure Laterality Date  . Cholecystectomy    . Tubal ligation    . Endometrial ablation    . Abdominal hysterectomy    . Laparoscopic salpingo oopherectomy Left 10/10/2013    Procedure: LAPAROSCOPIC LEFT SALPINGO OOPHORECTOMY;  Surgeon: Florian Buff, MD;  Location: [facility];  Service: Gynecology;  Laterality: Left;  . Laparoscopic lysis of adhesions N/A 10/10/2013    Procedure: LAPAROSCOPIC LYSIS OF ADHESIONS;  Surgeon: Florian Buff, MD;  Location: [facility];  Service: Gynecology;  Laterality: N/A;   Family History:  Family History  Problem Relation Age of Onset  . Diabetes Mother   . Hypertension Mother   . Cancer Mother     kidney  . Hypertension Father   . Depression Father   . Diabetes Maternal Grandmother   . COPD Maternal Grandmother    Social History:   History   Social History  . Marital Status: Married    Spouse Name: N/A  . Number of  Children: N/A  . Years of Education: N/A   Social History Main Topics  . Smoking status: Never Smoker   . Smokeless tobacco: Never Used  . Alcohol Use: No  . Drug Use: No  . Sexual Activity: Not Currently    Birth Control/ Protection: None, Surgical   Other Topics Concern  . None   Social History Narrative   Marital Status: Married Civil engineer, contracting)   Children:  Son Hart Carwin) Daughters (Alba, Raquel Sarna)   Pets: Marina Gravel  (1)    Living Situation: Lives with husband and children     Occupation: Geologist, engineering  Scientist, clinical (histocompatibility and immunogenetics))      Education: Programmer, systems     Tobacco Use/Exposure:  None    Alcohol Use:  None    Drug Use:  None   Diet:  Regular   Exercise:  Walking (Twice per week)    Hobbies: Traveling         Additional Social History: none  Musculoskeletal: Strength & Muscle Tone: within normal limits Gait & Station: normal Patient leans: N/A  Psychiatric Specialty Exam: HPI  ROS  Blood pressure 142/98, pulse 89, weight 244 lb (110.678 kg), last menstrual period 09/04/2012.Body mass index is 43.23 kg/(m^2).  General Appearance: Casual  Eye Contact:  Good  Speech:  Clear and Coherent  Volume:  Normal  Mood:  Depressed  Affect:  Congruent  Thought Process:  Coherent and Logical  Orientation:  Full (Time, Place, and Person)  Thought Content:  Negative  Suicidal Thoughts:  No  Homicidal Thoughts:  No  Memory:  Immediate;   Good Recent;   Good Remote;   Good  Judgement:  Good  Insight:  Good  Psychomotor Activity:  Normal  Concentration:  Good  Recall:  Good  Fund of Knowledge:Good  Language: Good  Akathisia:  Negative  Handed:  Right  AIMS (if indicated):  0  Assets:  Communication Skills Desire for Improvement Financial Resources/Insurance Housing Physical Health Talents/Skills Transportation Vocational/Educational  ADL's:  Intact  Cognition: WNL  Sleep:  increased   Is the patient at risk to self?  No. Has the patient been a risk to self in the past 6 months?  No. Has the patient been a risk to self within the distant past?  No. Is the patient a risk to others?  No. Has the patient been a risk to others in the past 6 months?  No. Has the patient been a risk to others within the distant past?  No.  Allergies:  Not on File Current Medications: Current Outpatient Prescriptions  Medication Sig Dispense Refill  . gabapentin (NEURONTIN) 300 MG capsule Take 1 capsule (300 mg total) by mouth 3 (three) times daily. 90 capsule 2  . ketorolac (TORADOL) 10 MG tablet Take 1  tablet (10 mg total) by mouth every 8 (eight) hours as needed. 15 tablet 0  . LORazepam (ATIVAN) 0.5 MG tablet Take 1 tablet (0.5 mg total) by mouth every 8 (eight) hours as needed for anxiety. 60 tablet 0  . MYRBETRIQ 50 MG TB24 tablet Take 50 mg by mouth daily.     . ondansetron (ZOFRAN) 8 MG tablet Take 1 tablet (8 mg total) by mouth every 8 (eight) hours as needed for nausea. 12 tablet 0  . oxyCODONE-acetaminophen (PERCOCET) 7.5-325 MG per tablet Take 1-2 tablets by mouth every 6 (six) hours as needed for pain. 30 tablet 0  . valsartan (DIOVAN) 80 MG tablet Take 80 mg by mouth daily.  No current facility-administered medications for this visit.    Previous Psychotropic Medications: Yes   Substance Abuse History in the last 12 months:  No.  Consequences of Substance Abuse: Negative  Medical Decision Making:  Established Problem, Worsening (2)  Treatment Plan Summary: Medication management  Switch clonazepam to lorazepam to see if it does not make her as tired, continue Cymbalta 60 mg daily, continue gabapentin 300 mg tid.  Consider adding buproprion or stimulant or atypical in the future if needed.  Already has appointment to see the therapist.  Return for med management in 3 weeks.    TAYLOR,GERALD D 6/2/20162:29 PM

## 2014-07-19 ENCOUNTER — Ambulatory Visit (INDEPENDENT_AMBULATORY_CARE_PROVIDER_SITE_OTHER): Payer: Managed Care, Other (non HMO) | Admitting: Psychology

## 2014-07-19 ENCOUNTER — Encounter (HOSPITAL_COMMUNITY): Payer: Self-pay | Admitting: Psychology

## 2014-07-19 DIAGNOSIS — F411 Generalized anxiety disorder: Secondary | ICD-10-CM | POA: Diagnosis not present

## 2014-07-19 DIAGNOSIS — F331 Major depressive disorder, recurrent, moderate: Secondary | ICD-10-CM

## 2014-07-23 ENCOUNTER — Encounter (HOSPITAL_COMMUNITY): Payer: Self-pay | Admitting: Psychology
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
## 2014-08-08 ENCOUNTER — Ambulatory Visit (HOSPITAL_COMMUNITY): Payer: Self-pay | Admitting: Psychiatry

## 2014-08-13 ENCOUNTER — Ambulatory Visit (INDEPENDENT_AMBULATORY_CARE_PROVIDER_SITE_OTHER): Payer: Managed Care, Other (non HMO) | Admitting: Psychology

## 2014-08-13 DIAGNOSIS — F331 Major depressive disorder, recurrent, moderate: Secondary | ICD-10-CM | POA: Diagnosis not present

## 2014-08-13 DIAGNOSIS — F411 Generalized anxiety disorder: Secondary | ICD-10-CM

## 2014-08-14 NOTE — Progress Notes (Signed)
   THERAPIST PROGRESS NOTE  Session Time: 3.30pm-4.18pm  Participation Level: Active  Behavioral Response: Well GroomedAlertDepressed  Type of Therapy: Individual Therapy  Treatment Goals addressed: Diagnosis: MDD and goal 1.  Interventions: CBT and Supportive  Summary: Crystal Rojas is a 34 y.o. female who presents with depressed mood and tearful at times.  Pt reported that her worry and ruminating thoughts is less w/ medication.  Pt reported that she is taking the klonopin prescribed by her PCP as lorazepam didn't work.  Pt reports that she has been struggling w/ getting out of bed and accomplishing things in her day.  Pt reported that she did feel good about taking her son to counselor for first time yesterday- but felt guilty as his biggest worry was his mom and how she is doing.  Pt was able to recognize that her son's feelings are ok as long as she doesn't put burden of taking care of her. Pt discussed that her husband has been more understanding of her depression and has been a support and cheerleader for her on weekends which is helpful.  Pt is concerned about returning to work as currently struggles to functioning day to day to get out of bed. Pt reported that she feels that if she can accomplish a few things around the house she will feel more motivated and discussed steps she believes is realistic towards this.  Pt agreed to focus on increased subtle movements, leaving her room more.     Suicidal/Homicidal: Nowithout intent/plan  Therapist Response: Assessed pt current functioning per pt report.  Processed w/ pt her depressed mood and explored w/ pt loss of interest and struggles to engaged.  Discussed making small changes w/out judging self and self compassion for more encouraging of self.    Plan: Return again in 1 weeks.  Diagnosis: MDD    Forde RadonYATES,Jennier Schissler, [facility] 08/14/2014

## 2014-08-15 ENCOUNTER — Telehealth (HOSPITAL_COMMUNITY): Payer: Self-pay

## 2014-08-15 ENCOUNTER — Telehealth (HOSPITAL_COMMUNITY): Payer: Self-pay | Admitting: Psychiatry

## 2014-08-15 NOTE — Telephone Encounter (Signed)
Medication refill request for 90 day supply of patient's Gabapentin received.  Patient returns to see Dr. Rutherford Limerickadepalli on 08/21/14.

## 2014-08-15 NOTE — Telephone Encounter (Signed)
A:  Had Dr. Lolly MustacheArfeen to sign the behavioral health clinician statement.  [facility] [phone]).  Informed pt that forms have been faxed and her copy is at the front desk.  Informed Everlene BallsShawn Taylor, RN (clinical mgr).  R:  Pt receptive.

## 2014-08-20 ENCOUNTER — Ambulatory Visit (HOSPITAL_COMMUNITY): Payer: Self-pay | Admitting: Psychology

## 2014-08-21 ENCOUNTER — Ambulatory Visit (INDEPENDENT_AMBULATORY_CARE_PROVIDER_SITE_OTHER): Payer: Managed Care, Other (non HMO) | Admitting: Psychiatry

## 2014-08-21 ENCOUNTER — Encounter (HOSPITAL_COMMUNITY): Payer: Self-pay | Admitting: Psychiatry

## 2014-08-21 VITALS — BP 146/98 | HR 87 | Ht 63.0 in | Wt 243.8 lb

## 2014-08-21 DIAGNOSIS — F411 Generalized anxiety disorder: Secondary | ICD-10-CM

## 2014-08-21 DIAGNOSIS — F331 Major depressive disorder, recurrent, moderate: Secondary | ICD-10-CM

## 2014-08-21 MED ORDER — DULOXETINE HCL 60 MG PO CPEP: 60.0000 mg | ORAL_CAPSULE | Freq: Every day | ORAL | Status: DC

## 2014-08-21 MED ORDER — CLONAZEPAM 0.5 MG PO TABS
0.5000 mg | ORAL_TABLET | Freq: Two times a day (BID) | ORAL | Status: DC
Start: 2014-08-21 — End: 2014-10-17

## 2014-08-21 MED ORDER — BUPROPION HCL ER (SR) 150 MG PO TB12: 150.0000 mg | ORAL_TABLET | Freq: Two times a day (BID) | ORAL | Status: DC

## 2014-08-21 NOTE — Progress Notes (Signed)
Psychiatric Initial Adult Assessment   Patient Identification: Crystal Rojas MRN:  [id number] Date of Evaluation:  08/21/2014 Referral Source: IOP Chief Complaint:anxiety and depression   Visit Diagnosis: major depression, recurrent, moderate. Generalized anxiety disorder Diagnosis:   Patient Active Problem List   Diagnosis Date Noted  . Generalized anxiety disorder [F41.1] 06/12/2014    Class: Chronic  . Depression, major, recurrent, moderate [F33.1] 06/12/2014    Class: Chronic  . Migraine headache [G43.909] 07/12/2012  . Anxiety state, unspecified [F41.1] 07/12/2012  . Morbid obesity [E66.01] 07/12/2012   History of Present Illness:  34 year old white married female mother of 3 children who was transferred from IOP where she had been treated for depression and anxiety. Patient states that she was started on Cymbalta by her PCP and then Dr. Lovena Le in IOP put her on Klonopin 0.5 3 times a day been switched her to Ativan 0.5 mg 3 times a day. Patient states the Ativan does not help her anxiety. She also cc anti-AIDS for therapy.     She is stressed out with her job (responsibilities increased with more micro management and fear of being fired if quotas are not met), marriage ( husband is 22 years older and very controlling calling 4-5 times daily to check whether the children had a bath , got to bed on time etc.  He also has an extreme temper so that she and the 3 children are walking on egg shells all the time to not upset him) and the fact that her son has a disorder that is causing him to go blind  Patient was discharged from IOP and since she was unable to see Dr. Doyne Keel, was seen by Dr. Lovena Le and now is here to see me. Patient continues to report that she still has hypersomnia, feels tired no energy appetite is poor and she has lost weight about 15 pounds in the past 4 months, mood continues to be dysphoric and anxious and depressed. Denies panic attacks denies feeling hopeless and  helpless, denies suicidal or homicidal ideation and has no hallucinations or delusions.  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 Patient was encouraged to give it a try and see how things cement for her once she started her work. Because of her lack of energy and continued depression Dr. Lovena Le had indicated a trial of Wellbutrin and so I discussed the rationale risks benefits options of Wellbutrin with the patient and obtained informed consent she'll be started on Wellbutrin. SR 150 mg a.m. and p.m.  Elements:  Location:  depression and anxiety. Quality:  cannot work or relax. Severity:  not suicidal but is interfering with job, marriage and ability to parent. Timing:  son's blindness was the last straw. Duration:  a year at least. Context:  as above. Associated Signs/Symptoms: Depression Symptoms:  depressed mood, anhedonia, hypersomnia, fatigue, difficulty concentrating, anxiety, (Hypo) Manic Symptoms:  none Anxiety Symptoms:  Excessive Worry, Psychotic Symptoms:  none PTSD Symptoms: Negative  Past Medical History: Depression anxiety and everything listed below Past Medical History  Diagnosis Date  . Anxiety   . Allergy   . Morbid obesity   . DDD (degenerative disc disease), lumbar   . History of migraine headaches   . Hypertension   . Depression     Past Surgical History  Procedure Laterality Date  . Cholecystectomy    . Tubal ligation    . Endometrial ablation    .  Abdominal hysterectomy    . Laparoscopic salpingo oopherectomy Left 10/10/2013    Procedure: LAPAROSCOPIC LEFT SALPINGO OOPHORECTOMY;  Surgeon: Lazaro Arms, MD;  Location: [facility];  Service: Gynecology;  Laterality: Left;  . Laparoscopic lysis of adhesions N/A 10/10/2013    Procedure: LAPAROSCOPIC LYSIS OF ADHESIONS;  Surgeon: Lazaro Arms, MD;  Location: [facility];  Service: Gynecology;  Laterality: N/A;    Family History: Mom has a history of depression and anxiety Family History  Problem Relation Age of Onset  . Diabetes Mother   . Hypertension Mother   . Cancer Mother     kidney  . Hypertension Father   . Depression Father   . Diabetes Maternal Grandmother   . COPD Maternal Grandmother   . Depression Paternal Grandmother    Social History:  Patient lives with her husband and 3 children History   Social History  . Marital Status: Married    Spouse Name: N/A  . Number of Children: N/A  . Years of Education: N/A   Social History Main Topics  . Smoking status: Never Smoker   . Smokeless tobacco: Never Used  . Alcohol Use: No  . Drug Use: No  . Sexual Activity: Not Currently    Birth Control/ Protection: None, Surgical   Other Topics Concern  . Not on file   Social History Narrative   Marital Status: Married Film/video editor)   Children:  Son Janyth Pupa) Daughters ([place], [place] Burton)   Pets: Egbert Garibaldi  (1)    Living Situation: Lives with husband and children     Occupation: Training and development officer  Administrator)     Education: Engineer, agricultural     Tobacco Use/Exposure:  None    Alcohol Use:  None    Drug Use:  None   Diet:  Regular   Exercise:  Walking (Twice per week)    Hobbies: Traveling         Additional Social History: none  Musculoskeletal: Strength & Muscle Tone: within normal limits Gait & Station: normal Patient leans: N/A  Psychiatric Specialty Exam: HPI  Review of Systems  Constitutional: Positive for malaise/fatigue. Negative for fever, chills, weight loss and diaphoresis.  HENT: Negative for ear pain, hearing loss, nosebleeds and tinnitus.   Eyes: Negative for blurred vision, double vision, photophobia and pain.  Respiratory: Negative for cough, hemoptysis, sputum production, shortness of breath and wheezing.   Cardiovascular: Positive for palpitations. Negative for chest pain, orthopnea, claudication and leg swelling.  Gastrointestinal: Negative for vomiting,  abdominal pain, diarrhea, constipation, blood in stool and melena.  Genitourinary: Negative for dysuria, urgency, frequency, hematuria and flank pain.  Musculoskeletal: Positive for myalgias. Negative for back pain, joint pain, falls and neck pain.  Skin: Negative for itching and rash.  Neurological: Positive for headaches. Negative for dizziness, tingling, tremors, sensory change, speech change, focal weakness, seizures and weakness.  Endo/Heme/Allergies: Negative for environmental allergies and polydipsia. Does not bruise/bleed easily.  Psychiatric/Behavioral: Positive for depression. The patient is nervous/anxious.     Blood pressure 146/98, pulse 87, height 5\' 3"  (1.6 m), weight 243 lb 12.8 oz (110.587 kg), last menstrual period 09/04/2012.Body mass index is 43.2 kg/(m^2).  General Appearance: Casual  Eye Contact:  Good  Speech:  Clear and Coherent  Volume:  Normal  Mood:  Depressed  Affect:  Congruent  Thought Process:  Coherent and Logical  Orientation:  Full (Time, Place, and Person)  Thought Content:  Negative  Suicidal Thoughts:  No  Homicidal Thoughts:  No  Memory:  Immediate;   Good Recent;   Good Remote;   Good  Judgement:  Good  Insight:  Good  Psychomotor Activity:  Normal  Concentration:  Good  Recall:  Good  Fund of Knowledge:Good  Language: Good  Akathisia:  Negative  Handed:  Right  AIMS (if indicated):  0  Assets:  Communication Skills Desire for Improvement Financial Resources/Insurance Housing Physical Health Talents/Skills Transportation Vocational/Educational  ADL's:  Intact  Cognition: WNL  Sleep:  increased   Is the patient at risk to self?  No. Has the patient been a risk to self in the past 6 months?  No. Has the patient been a risk to self within the distant past?  No. Is the patient a risk to others?  No. Has the patient been a risk to others in the past 6 months?  No. Has the patient been a risk to others within the distant past?   No.  Allergies:  None Current Medications: Current Outpatient Prescriptions  Medication Sig Dispense Refill  . clonazePAM (KLONOPIN) 0.5 MG tablet Take 0.5 mg by mouth 3 (three) times daily as needed for anxiety.    . DULoxetine (CYMBALTA) 60 MG capsule Take 60 mg by mouth daily.    Marland Kitchen gabapentin (NEURONTIN) 300 MG capsule Take 1 capsule (300 mg total) by mouth 3 (three) times daily. 90 capsule 2  . ketorolac (TORADOL) 10 MG tablet Take 1 tablet (10 mg total) by mouth every 8 (eight) hours as needed. 15 tablet 0  . LORazepam (ATIVAN) 0.5 MG tablet Take 1 tablet (0.5 mg total) by mouth every 8 (eight) hours as needed for anxiety. (Patient not taking: Reported on 08/13/2014) 60 tablet 0  . MYRBETRIQ 50 MG TB24 tablet Take 50 mg by mouth daily.     . ondansetron (ZOFRAN) 8 MG tablet Take 1 tablet (8 mg total) by mouth every 8 (eight) hours as needed for nausea. 12 tablet 0  . oxyCODONE-acetaminophen (PERCOCET) 7.5-325 MG per tablet Take 1-2 tablets by mouth every 6 (six) hours as needed for pain. 30 tablet 0  . valsartan (DIOVAN) 80 MG tablet Take 80 mg by mouth daily.     No current facility-administered medications for this visit.    Previous Psychotropic Medications: Yes   Substance Abuse History in the last 12 months:  No.  Consequences of Substance Abuse: Negative  Medical Decision Making:  Established problem mildly improving  Treatment Plan Summary: Medication management  Patient will be continued on Cymbalta 60 mg by mouth daily for her depression and anxiety. Generalized anxiety will be treated with Klonopin 0.5 mg by mouth 3 times a day patient has given me her informed consent. DC Ativan. Depression will also be treated with Wellbutrin SR 150 mg by mouth a.m. and at noon. Anxiety and mood stabilization will be treated with gabapentin 300 mg by mouth 3 times a day.  Patient will continue to see Archie Endo for therapy and will focus on developing coping skills and action  alternatives to her depression. Cognitive restructuring of her cognitive distortions interpersonal and supportive therapy will be provided.   At the present time patient is not a suicide risk and will return to see me in the clinic in one month.   Erin Sons 7/6/201610:42 AM

## 2014-08-27 ENCOUNTER — Ambulatory Visit (HOSPITAL_COMMUNITY): Payer: Self-pay | Admitting: Psychology

## 2014-09-02 ENCOUNTER — Encounter: Payer: Self-pay | Admitting: Gastroenterology

## 2014-09-03 ENCOUNTER — Ambulatory Visit (INDEPENDENT_AMBULATORY_CARE_PROVIDER_SITE_OTHER): Payer: Managed Care, Other (non HMO) | Admitting: Psychology

## 2014-09-03 DIAGNOSIS — F411 Generalized anxiety disorder: Secondary | ICD-10-CM | POA: Diagnosis not present

## 2014-09-03 DIAGNOSIS — F332 Major depressive disorder, recurrent severe without psychotic features: Secondary | ICD-10-CM | POA: Diagnosis not present

## 2014-09-03 NOTE — Progress Notes (Signed)
   THERAPIST PROGRESS NOTE  Session Time: 9.03am-9.56am  Participation Level: Active  Behavioral Response: Well GroomedAlertDepressed and Tearful  Type of Therapy: Individual Therapy  Treatment Goals addressed: Diagnosis: MDD, GAD and goal 1.  Interventions: CBT and Supportive  Summary: Crystal Rojas is a 34 y.o. female who presents with affect congruent w/ report of depressed mood and fatigue.  Pt reports that she feels broken and is struggling to go to work daily. Pt reported that she started back to work on 08/26/14 and struggles to maintain at her job- tearful throughout the day. Pt feels she needs more intensive tx as struggling to cope w/ stressors.  Pt reported feeling more depressed- acknowledges this could be the stress of work and stress of her aunt being dx w/ advanced stage of cancer.  Pt reports she is exhausted daily and no energy to do anything when returns home. Pt reported that the week prior to returning she did increase w/ activity level as discussed and did something daily w/ the kids and getting out of her room.  Pt discussed whether her current job is worth staying in due to stress.  Pt did set boundary w/ supervisor that can't take on extras she had been to reduce stress and felt good that asserted herself.  Pt agrees to engaging w/ supports/ initiating a self care activity daily for at least 30 minutes as has found beneficial to mood when does.  Suicidal/Homicidal: Nowithout intent/plan  Therapist Response: Assessed pt current functioning per pt report.  Processed w/pt increased depressed mood and recent stressors.  Discussed pt self care and progress she had made w/ increased activity level and asserting self needs.  Explored pt needs for self w/ mental health and evaluating what is important to her in a job and balance w/ family.  Discussed options for tx. Plan: Return again in 1 week.  [facility]  [facility].  Diagnosis: MDD    Forde RadonYATES,LEANNE, [facility] 09/03/2014

## 2014-09-10 ENCOUNTER — Ambulatory Visit (HOSPITAL_COMMUNITY): Payer: Self-pay | Admitting: Psychology

## 2014-09-17 ENCOUNTER — Ambulatory Visit (HOSPITAL_COMMUNITY): Payer: Self-pay | Admitting: Psychiatry

## 2014-09-17 ENCOUNTER — Ambulatory Visit (HOSPITAL_COMMUNITY): Payer: Self-pay | Admitting: Psychology

## 2014-09-19 ENCOUNTER — Other Ambulatory Visit: Payer: Self-pay | Admitting: Gastroenterology

## 2014-09-19 ENCOUNTER — Ambulatory Visit: Payer: Self-pay | Admitting: Gastroenterology

## 2014-09-19 DIAGNOSIS — R1084 Generalized abdominal pain: Secondary | ICD-10-CM

## 2014-09-19 DIAGNOSIS — R634 Abnormal weight loss: Secondary | ICD-10-CM

## 2014-09-20 ENCOUNTER — Ambulatory Visit
Admission: RE | Admit: 2014-09-20 | Discharge: 2014-09-20 | Disposition: A | Discharge status: Home / Self Care | Payer: Managed Care, Other (non HMO) | Source: Ambulatory Visit | Attending: Gastroenterology | Admitting: Gastroenterology

## 2014-09-20 DIAGNOSIS — R634 Abnormal weight loss: Secondary | ICD-10-CM

## 2014-09-20 DIAGNOSIS — R1084 Generalized abdominal pain: Secondary | ICD-10-CM

## 2014-09-20 MED ORDER — IOPAMIDOL (ISOVUE-300) INJECTION 61%
125.0000 mL | Freq: Once | INTRAVENOUS | Status: AC | PRN
  Administered 2014-09-20: 125 mL via INTRAVENOUS

## 2014-09-24 ENCOUNTER — Ambulatory Visit (HOSPITAL_COMMUNITY): Payer: Self-pay | Admitting: Psychology

## 2014-09-27 ENCOUNTER — Other Ambulatory Visit: Payer: Self-pay | Admitting: Gastroenterology

## 2014-09-27 DIAGNOSIS — R634 Abnormal weight loss: Secondary | ICD-10-CM

## 2014-09-27 DIAGNOSIS — R1084 Generalized abdominal pain: Secondary | ICD-10-CM

## 2014-09-27 DIAGNOSIS — R9389 Abnormal findings on diagnostic imaging of other specified body structures: Secondary | ICD-10-CM

## 2014-10-01 ENCOUNTER — Ambulatory Visit (HOSPITAL_COMMUNITY): Payer: Self-pay | Admitting: Psychology

## 2014-10-08 ENCOUNTER — Ambulatory Visit (HOSPITAL_COMMUNITY): Payer: Self-pay | Admitting: Psychology

## 2014-10-15 ENCOUNTER — Ambulatory Visit (HOSPITAL_COMMUNITY): Payer: Self-pay | Admitting: Psychology

## 2014-10-17 ENCOUNTER — Ambulatory Visit
Admission: RE | Admit: 2014-10-17 | Discharge: 2014-10-17 | Disposition: A | Discharge status: Home / Self Care | Payer: Managed Care, Other (non HMO) | Source: Ambulatory Visit | Attending: Gastroenterology | Admitting: Gastroenterology

## 2014-10-17 ENCOUNTER — Ambulatory Visit (INDEPENDENT_AMBULATORY_CARE_PROVIDER_SITE_OTHER): Payer: Managed Care, Other (non HMO) | Admitting: Psychiatry

## 2014-10-17 ENCOUNTER — Encounter (HOSPITAL_COMMUNITY): Payer: Self-pay | Admitting: Psychiatry

## 2014-10-17 VITALS — BP 130/86 | HR 81 | Ht 63.0 in | Wt 246.6 lb

## 2014-10-17 DIAGNOSIS — R1084 Generalized abdominal pain: Secondary | ICD-10-CM

## 2014-10-17 DIAGNOSIS — F321 Major depressive disorder, single episode, moderate: Secondary | ICD-10-CM | POA: Diagnosis not present

## 2014-10-17 DIAGNOSIS — R9389 Abnormal findings on diagnostic imaging of other specified body structures: Secondary | ICD-10-CM

## 2014-10-17 DIAGNOSIS — F411 Generalized anxiety disorder: Secondary | ICD-10-CM | POA: Diagnosis not present

## 2014-10-17 DIAGNOSIS — R634 Abnormal weight loss: Secondary | ICD-10-CM

## 2014-10-17 MED ORDER — ARIPIPRAZOLE 5 MG PO TABS: 5.0000 mg | ORAL_TABLET | Freq: Every day | ORAL | Status: DC

## 2014-10-17 MED ORDER — GADOBENATE DIMEGLUMINE 529 MG/ML IV SOLN
20.0000 mL | Freq: Once | INTRAVENOUS | Status: AC | PRN
  Administered 2014-10-17: 20 mL via INTRAVENOUS

## 2014-10-17 MED ORDER — CLONAZEPAM 0.5 MG PO TABS: 0.5000 mg | ORAL_TABLET | Freq: Two times a day (BID) | ORAL | Status: DC

## 2014-10-17 MED ORDER — DULOXETINE HCL 60 MG PO CPEP: 60.0000 mg | ORAL_CAPSULE | Freq: Every day | ORAL | Status: DC

## 2014-10-17 NOTE — Progress Notes (Signed)
[facility] MD Progress Note  10/17/2014 11:29 AM Crystal Rojas  MRN:  [id number] Subjective:  I'm beginning to feel a little better  Diagnosis:   Patient Active Problem List   Diagnosis Date Noted  . Generalized anxiety disorder [F41.1] 06/12/2014    Class: Chronic  . Depression, major, recurrent, moderate [F33.1] 06/12/2014    Class: Chronic  . Migraine headache [G43.909] 07/12/2012  . Anxiety state, unspecified [F41.1] 07/12/2012  . Morbid obesity [E66.01] 07/12/2012    Assessment:  Patient seen today for follow-up, states that after her last visit with me she went to partial hospital at [facility]. Was there from mid July to August and states that her Wellbutrin was discontinued and she was put on Abilify 2 mg at bedtime. Patient states this has helped her a lot her mood is beginning to improve. She states that the doctor there was planning to increase it but then went on vacation so it did not happen.  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 She also has set limits with her husband was been treating her much better now.  Patient continues to grieve her son's blindness which is a hairy jittery disease. Patient was offered supportive therapy for this.  States that her sleep is good but she has terminal insomnia and wakes up at 4:30 in the morning, appetite is good mood is improving significantly still has anxiety and ruminates and worries about her son. Denies feeling hopeless and helpless denies suicidal or homicidal ideation and has no hallucinations or delusions. States the Klonopin makes her irritable in the morning was told to take it later in the day. Discussed increasing Abilify to 5 mg at bedtime she stated understanding  Past Medical History:  Past Medical History  Diagnosis Date  . Anxiety   . Allergy   . Morbid  obesity   . DDD (degenerative disc disease), lumbar   . History of migraine headaches   . Hypertension   . Depression     Past Surgical History  Procedure Laterality Date  . Cholecystectomy    . Tubal ligation    . Endometrial ablation    . Abdominal hysterectomy    . Laparoscopic salpingo oopherectomy Left 10/10/2013    Procedure: LAPAROSCOPIC LEFT SALPINGO OOPHORECTOMY;  Surgeon: Florian Buff, MD;  Location: [facility];  Service: Gynecology;  Laterality: Left;  . Laparoscopic lysis of adhesions N/A 10/10/2013    Procedure: LAPAROSCOPIC LYSIS OF ADHESIONS;  Surgeon: Florian Buff, MD;  Location: [facility];  Service: Gynecology;  Laterality: N/A;   Family History:  Family History  Problem Relation Age of Onset  . Diabetes Mother   . Hypertension Mother   . Cancer Mother     kidney  . Hypertension Father   . Depression Father   . Diabetes Maternal Grandmother   . COPD Maternal Grandmother   . Depression Paternal Grandmother    Social History:  History  Alcohol Use No     History  Drug Use No    Social History   Social History  . Marital Status: Married    Spouse Name: N/A  . Number of Children: N/A  . Years of Education: N/A   Social History Main Topics  . Smoking status: Never Smoker   . Smokeless tobacco: Never Used  .  Alcohol Use: No  . Drug Use: No  . Sexual Activity: Not Currently    Birth Control/ Protection: None, Surgical   Other Topics Concern  . Not on file   Social History Narrative   Marital Status: Married Civil engineer, contracting)   Children:  Son Crystal Rojas) Daughters (Crystal Rojas, Crystal Rojas)   Pets: Marina Gravel  (1)    Living Situation: Lives with husband and children     Occupation: Geologist, engineering  Scientist, clinical (histocompatibility and immunogenetics))     Education: Programmer, systems     Tobacco Use/Exposure:  None    Alcohol Use:  None    Drug Use:  None   Diet:  Regular   Exercise:  Walking (Twice per week)    Hobbies: Traveling         Additional History from her initial assessment on 08/21/2014:   34 year old white married female mother of 3 children who was transferred from IOP where she had been treated for depression and anxiety. Patient states that she was started on Cymbalta by her PCP and then Dr. Lovena Le in IOP put her on Klonopin 0.5 3 times a day been switched her to Ativan 0.5 mg 3 times a day. Patient states the Ativan does not help her anxiety. She also cc anti-AIDS for therapy.    She is stressed out with her job (responsibilities increased with more micro management and fear of being fired if quotas are not met), marriage ( husband is 54 years older and very controlling calling 4-5 times daily to check whether the children had a bath , got to bed on time etc. He also has an extreme temper so that she and the 3 children are walking on egg shells all the time to not upset him) and the fact that her son has a disorder that is causing him to go blind  Patient was discharged from IOP and since she was unable to see Dr. Doyne Keel, was seen by Dr. Lovena Le and now is here to see me. Patient continues to report that she still has hypersomnia, feels tired no energy appetite is poor and she has lost weight about 15 pounds in the past 4 months, mood continues to be dysphoric and anxious and depressed. Denies panic attacks denies feeling hopeless and helpless, denies suicidal or homicidal ideation and has no hallucinations or delusions.  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 Patient was encouraged to give it a try and see how things cement for her once she started her work. Because of her lack of energy and continued depression Dr. Lovena Le had indicated a trial of Wellbutrin and so I discussed the rationale risks benefits options of Wellbutrin with the patient and obtained informed consent she'll be started on Wellbutrin. SR 150 mg a.m. and p.m.   Sleep: Fair  Appetite:   Good     Musculoskeletal: Strength & Muscle Tone: within normal limits Gait & Station: normal Patient leans: stands straight   Psychiatric Specialty Exam: Physical Exam  Review of Systems  Constitutional: Negative for fever, chills, weight loss, malaise/fatigue and diaphoresis.  HENT: Negative for congestion, ear discharge, ear pain, hearing loss, nosebleeds, sore throat and tinnitus.   Eyes: Negative for blurred vision, double vision, photophobia, pain, discharge and redness.  Respiratory: Negative for cough, hemoptysis, sputum production, shortness of breath, wheezing and stridor.   Cardiovascular: Negative for chest pain, palpitations, orthopnea, claudication and leg swelling.  Gastrointestinal: Negative for heartburn,  nausea, vomiting, abdominal pain, diarrhea, constipation, blood in stool and melena.  Genitourinary: Negative for dysuria, urgency, frequency, hematuria and flank pain.  Musculoskeletal: Negative for myalgias, back pain, joint pain, falls and neck pain.  Skin: Negative for itching and rash.  Neurological: Negative for dizziness, tingling, tremors, sensory change, speech change, focal weakness, seizures, loss of consciousness, weakness and headaches.  Endo/Heme/Allergies: Negative for environmental allergies and polydipsia. Does not bruise/bleed easily.  Psychiatric/Behavioral: Positive for depression. The patient is nervous/anxious and has insomnia.     Blood pressure 130/86, pulse 81, height $RemoveBe'5\' 3"'RKGPBiObR$  (1.6 m), weight 246 lb 9.6 oz (111.857 kg), last menstrual period 09/04/2012.Body mass index is 43.69 kg/(m^2).  General Appearance: Casual  Eye Contact::  Good  Speech:  Clear and Coherent and Normal Rate  Volume:  Normal  Mood:  Anxious and Depressed  Affect:  Appropriate and Constricted  Thought Process:  Goal Directed, Linear and Logical  Orientation:  Full (Time, Place, and Person)  Thought Content:  Rumination  Suicidal Thoughts:  No  Homicidal Thoughts:  No   Memory:  Immediate;   Good Recent;   Good Remote;   Good  Judgement:  Good  Insight:  Good  Psychomotor Activity:  Normal  Concentration:  Good  Recall:  Good  Fund of Knowledge:Good  Language: Good  Akathisia:  No  Handed:  Right  AIMS (if indicated):     Assets:  Communication Skills Desire for Improvement Physical Health Resilience Social Support  ADL's:  Intact  Cognition: WNL  Sleep:        Current Medications: Current Outpatient Prescriptions  Medication Sig Dispense Refill  . buPROPion (WELLBUTRIN SR) 150 MG 12 hr tablet Take 1 tablet (150 mg total) by mouth 2 (two) times daily. 60 tablet 2  . clonazePAM (KLONOPIN) 0.5 MG tablet Take 1 tablet (0.5 mg total) by mouth 2 (two) times daily. 30 tablet 1  . DULoxetine (CYMBALTA) 60 MG capsule Take 1 capsule (60 mg total) by mouth daily. 30 capsule 0  . ketorolac (TORADOL) 10 MG tablet Take 1 tablet (10 mg total) by mouth every 8 (eight) hours as needed. 15 tablet 0  . MYRBETRIQ 50 MG TB24 tablet Take 50 mg by mouth daily.     . ondansetron (ZOFRAN) 8 MG tablet Take 1 tablet (8 mg total) by mouth every 8 (eight) hours as needed for nausea. 12 tablet 0  . oxyCODONE-acetaminophen (PERCOCET) 7.5-325 MG per tablet Take 1-2 tablets by mouth every 6 (six) hours as needed for pain. 30 tablet 0  . valsartan (DIOVAN) 80 MG tablet Take 80 mg by mouth daily.     No current facility-administered medications for this visit.    Lab Results: No results found for this or any previous visit (from the past 48 hour(s)).  Physical Findings: AIMS: 0 CIWA:    COWS:     Treatment Plan Summary: Medication management Depression Patient will be continued on Cymbalta 60 mg by mouth daily for her depression and anxiety. Generalized anxiety  will be treated with Klonopin 0.5 mg by mouth 3 times a day patient has given me her informed consent. DC Wellbutrin SR 150 mg by mouth a.m. and at noon. This was done at the partial hospital at [facility] and mood stabilization  will be treated with gabapentin 300 mg by mouth 3 times a day. Continue Abilify that this started at [facility] but increase it to Abilify 5 mg by mouth daily at bedtime for mood  stabilization Therapy Patient will continue to see Archie Endo for therapy and will focus on developing coping skills and action alternatives to her depression. Cognitive restructuring of her cognitive distortions interpersonal and supportive therapy will be provided. Labs None at this visit At the present time patient is not a suicide risk and will return to see me in the clinic in one month. She can call sooner if necessary.  This visit was of high intensity and exceeded 25 minutes. 50% of the time was spent in discussing her stressors and also offering supportive therapy as patient was distraught regarding her son going blind. Patient was given encouragement to help him in his efforts to learn braille and not be disappointed because kids are fast learners and the adapt very well to change. Coping skills were discussed in detail.  Medical Decision Making:  Established Problem, Stable/Improving (1), Review of Psycho-Social Stressors (1), Review or order clinical lab tests (1), Decision to obtain old records (1), Order AIMS Test (2), Review of Last Therapy Session (1), Review of Medication Regimen & Side Effects (2) and Review of New Medication or Change in Dosage (2)     Seerat Peaden 10/17/2014, 11:29 AM

## 2014-10-18 ENCOUNTER — Ambulatory Visit (HOSPITAL_COMMUNITY): Payer: Self-pay | Admitting: Psychiatry

## 2014-10-23 ENCOUNTER — Ambulatory Visit (INDEPENDENT_AMBULATORY_CARE_PROVIDER_SITE_OTHER): Payer: Managed Care, Other (non HMO) | Admitting: Psychology

## 2014-10-23 DIAGNOSIS — F411 Generalized anxiety disorder: Secondary | ICD-10-CM | POA: Diagnosis not present

## 2014-10-23 DIAGNOSIS — F331 Major depressive disorder, recurrent, moderate: Secondary | ICD-10-CM | POA: Diagnosis not present

## 2014-10-23 NOTE — Progress Notes (Signed)
   THERAPIST PROGRESS NOTE  Session Time: 9am-9.48am  Participation Level: Active  Behavioral Response: Well GroomedAlertAFFECT WNL- some depressed moods  Type of Therapy: Individual Therapy  Treatment Goals addressed: Diagnosis: MDD, GAD and goal 1.  Interventions: CBT, Assertiveness Training and Supportive  Summary: Crystal Rojas is a 34 y.o. female who presents with affect WNL- at times tearful as discussing marital stressors.  Pt reported that since attending [facility] and starting on Abilify has been greatly improved- less depressed, less tearful, decreased feeling of hopelessness and less emotional lability and ruminating worries.  Pt reported that she has also begun making changes in her life that is under her control.  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  Pt also reported that things have improved w/her husband since asserting her feeling about interactions and lack of control to make decisions for family.  Pt discussed that he has been making an effort.  Pt did report negative interaction over weekend when he made a choice about cancelling son's birthday party w/out talking about together.  Pt discussed how she confronted him about and that he became upset.  Pt was able to explore how to communicate and continue to assert w/out blaming.  Pt discussed wanting to seek couples counseling and he agreed.  Pt was receptive to initiating communication seeking his input to resolve problem.   Suicidal/Homicidal: Nowithout intent/plan  Therapist Response: Assessed pt current functioning per pt report.  Processed w/ pt her mood and changes pt is making to effect her mental health.  Explored communication w/ husband and discussed assertive communication and how to initiate interactions for decision making together.    Plan: Return again in 2 weeks.   Pt reports starting new job and may not be able to f/u in 2 weeks.  Pt to continue w/ Dr. Rutherford Limerick.    Diagnosis: MDD, moderate and GAD    Jeanmarc Viernes, LPC 10/23/2014

## 2014-11-18 ENCOUNTER — Ambulatory Visit (HOSPITAL_COMMUNITY): Payer: Self-pay | Admitting: Psychology

## 2014-11-18 ENCOUNTER — Encounter (HOSPITAL_COMMUNITY): Payer: Self-pay | Admitting: Psychology

## 2014-11-18 NOTE — Progress Notes (Signed)
Crystal Rojas is a 33 y.o. female patient who didn't show for her appointment.  Letter sent.        Forde Radon, LPC

## 2014-11-20 ENCOUNTER — Ambulatory Visit (HOSPITAL_COMMUNITY): Payer: Self-pay | Admitting: Psychiatry

## 2014-12-10 ENCOUNTER — Telehealth (HOSPITAL_COMMUNITY): Payer: Self-pay

## 2014-12-10 NOTE — Telephone Encounter (Signed)
Telephone call with [facility] to follow up on a received call from them requesting a refill of patient's prescribed Abilify. Verified order Dr. Rutherford Limerickadepalli sent on 10/17/14 plus 2 refills and that patient still has one refill remaining.  Patient to call back if any other problems and order being filled this date.

## 2015-04-17 ENCOUNTER — Encounter (HOSPITAL_COMMUNITY): Payer: Self-pay | Admitting: Psychology

## 2015-04-17 NOTE — Progress Notes (Signed)
Crystal Rojas is a 35 y.o. female patient who is discharged from counseling as last seen 10/23/14 and no showed for f/u.  Outpatient Therapist Discharge Summary  XINYI BATTON    05/03/1980   Admission Date: 6/9/216   Discharge Date:  04/17/15 Reason for Discharge:  Not active Diagnosis:  MDD, GAD  Comments:  Pt no showed for f/u and didn't return.   Alfredo Batty, LPC

## 2015-05-29 ENCOUNTER — Ambulatory Visit (INDEPENDENT_AMBULATORY_CARE_PROVIDER_SITE_OTHER): Payer: Managed Care, Other (non HMO) | Admitting: Adult Health

## 2015-05-29 ENCOUNTER — Encounter: Payer: Self-pay | Admitting: Adult Health

## 2015-05-29 VITALS — BP 138/70 | HR 80 | Ht 65.0 in | Wt 257.0 lb

## 2015-05-29 DIAGNOSIS — N760 Acute vaginitis: Secondary | ICD-10-CM

## 2015-05-29 DIAGNOSIS — L298 Other pruritus: Secondary | ICD-10-CM | POA: Diagnosis not present

## 2015-05-29 DIAGNOSIS — B9689 Other specified bacterial agents as the cause of diseases classified elsewhere: Secondary | ICD-10-CM

## 2015-05-29 DIAGNOSIS — N898 Other specified noninflammatory disorders of vagina: Secondary | ICD-10-CM | POA: Insufficient documentation

## 2015-05-29 DIAGNOSIS — A499 Bacterial infection, unspecified: Secondary | ICD-10-CM

## 2015-05-29 DIAGNOSIS — B001 Herpesviral vesicular dermatitis: Secondary | ICD-10-CM

## 2015-05-29 HISTORY — DX: Other specified noninflammatory disorders of vagina: N89.8

## 2015-05-29 HISTORY — DX: Other specified bacterial agents as the cause of diseases classified elsewhere: B96.89

## 2015-05-29 HISTORY — DX: Herpesviral vesicular dermatitis: B00.1

## 2015-05-29 LAB — POCT WET PREP (WET MOUNT)
Clue Cells Wet Prep Whiff POC: NEGATIVE
WBC WET PREP: POSITIVE

## 2015-05-29 LAB — POCT URINALYSIS DIPSTICK
Blood, UA: NEGATIVE
Glucose, UA: NEGATIVE
LEUKOCYTES UA: NEGATIVE
Nitrite, UA: NEGATIVE
Protein, UA: NEGATIVE

## 2015-05-29 MED ORDER — VALACYCLOVIR HCL 1 G PO TABS: ORAL_TABLET | ORAL | Status: DC

## 2015-05-29 MED ORDER — METRONIDAZOLE 500 MG PO TABS: 500.0000 mg | ORAL_TABLET | Freq: Two times a day (BID) | ORAL | Status: DC

## 2015-05-29 NOTE — Patient Instructions (Signed)
Bacterial Vaginosis Bacterial vaginosis is a vaginal infection that occurs when the normal balance of bacteria in the vagina is disrupted. It results from an overgrowth of certain bacteria. This is the most common vaginal infection in women of childbearing age. Treatment is important to prevent complications, especially in pregnant women, as it can cause a premature delivery. CAUSES  Bacterial vaginosis is caused by an increase in harmful bacteria that are normally present in smaller amounts in the vagina. Several different kinds of bacteria can cause bacterial vaginosis. However, the reason that the condition develops is not fully understood. RISK FACTORS Certain activities or behaviors can put you at an increased risk of developing bacterial vaginosis, including:  Having a new sex partner or multiple sex partners.  Douching.  Using an intrauterine device (IUD) for contraception. Women do not get bacterial vaginosis from toilet seats, bedding, swimming pools, or contact with objects around them. SIGNS AND SYMPTOMS  Some women with bacterial vaginosis have no signs or symptoms. Common symptoms include:  Grey vaginal discharge.  A fishlike odor with discharge, especially after sexual intercourse.  Itching or burning of the vagina and vulva.  Burning or pain with urination. DIAGNOSIS  Your health care provider will take a medical history and examine the vagina for signs of bacterial vaginosis. A sample of vaginal fluid may be taken. Your health care provider will look at this sample under a microscope to check for bacteria and abnormal cells. A vaginal pH test may also be done.  TREATMENT  Bacterial vaginosis may be treated with antibiotic medicines. These may be given in the form of a pill or a vaginal cream. A second round of antibiotics may be prescribed if the condition comes back after treatment. Because bacterial vaginosis increases your risk for sexually transmitted diseases, getting  treated can help reduce your risk for chlamydia, gonorrhea, HIV, and herpes. HOME CARE INSTRUCTIONS   Only take over-the-counter or prescription medicines as directed by your health care provider.  If antibiotic medicine was prescribed, take it as directed. Make sure you finish it even if you start to feel better.  Tell all sexual partners that you have a vaginal infection. They should see their health care provider and be treated if they have problems, such as a mild rash or itching.  During treatment, it is important that you follow these instructions:  Avoid sexual activity or use condoms correctly.  Do not douche.  Avoid alcohol as directed by your health care provider.  Avoid breastfeeding as directed by your health care provider. SEEK MEDICAL CARE IF:   Your symptoms are not improving after 3 days of treatment.  You have increased discharge or pain.  You have a fever. MAKE SURE YOU:   Understand these instructions.  Will watch your condition.  Will get help right away if you are not doing well or get worse. FOR MORE INFORMATION  Centers for Disease Control and Prevention, Division of STD Prevention: www.cdc.gov/std American Sexual Health Association (ASHA): www.ashastd.org    This information is not intended to replace advice given to you by your health care provider. Make sure you discuss any questions you have with your health care provider.   Document Released: 02/01/2005 Document Revised: 02/22/2014 Document Reviewed: 09/13/2012 Elsevier Interactive Patient Education 2016 Elsevier Inc. No alcohol Follow up prn 

## 2015-05-29 NOTE — Progress Notes (Signed)
Subjective:     Patient ID: Crystal Rojas, female   DOB: 1980-09-13, 35 y.o.   MRN: [id number]  HPI Archie Pattenonya is a 35 year old white female, married in complaining of vaginal discharge, no odor, some itching and stomach hurt at bottom this week, she denies any nausea,vomiting or diarrhea or fever,she thought could be UTI.She has had recurrent fever blisters this week.  Review of Systems Patient denies any headaches, hearing loss, fatigue, blurred vision, shortness of breath, chest pain, problems with bowel movements, urination, or intercourse. No joint pain or mood swings.See HPI for positives. Reviewed past medical,surgical, social and family history. Reviewed medications and allergies.     Objective:   Physical Exam BP 138/70 mmHg  Pulse 80  Ht 5\' 5"  (1.651 m)  Wt 257 lb (116.574 kg)  BMI 42.77 kg/m2  LMP 09/04/2012 Urine dipstick negative, Skin warm and dry.Has cold sore on top lip. Pelvic: external genitalia is normal in appearance no lesions, vagina: white discharge without odor,urethra has no lesions or masses noted, cervix and uterus are absent  adnexa: no masses or tenderness noted. Bladder is non tender and no masses felt.Abdomen soft and non tender, no HSM noted.  Wet prep: + for few clue cells and +WBCs. No CVAT.Discussed that cold sores can come on from stress and UV light, sun exposure.Discussed BV, she does not wear thongs or take tub baths and has not had any antibiotics.Face time 15 minutes with 50 % counseling.     Assessment:     Vaginal discharge Vaginal itch BV  Recurrent cold sores    Plan:     Rx flagyl 500 mg 1 bid x 7 days, no alcohol, review handout on BV   Rx valtrex 1 gm #12 take 2 now and 2 tonight, with 1 refill Follow up prn

## 2015-08-26 ENCOUNTER — Other Ambulatory Visit (HOSPITAL_COMMUNITY): Payer: Self-pay | Admitting: Specialist

## 2015-10-06 ENCOUNTER — Other Ambulatory Visit: Payer: Self-pay | Admitting: Specialist

## 2016-09-08 IMAGING — CT CT ABD-PELV W/ CM
3 of 4 series · 12 of 36 positions shown, 18 images · IV contrast (READICAT/WATER & [ID] ISOVUE 300)
Comparison: None.

ADDENDUM:
I just spoke by telephone with Dr. Moko. The patient's mother has
recently had a nephrectomy for renal cell carcinoma. Given this
history, MRI of the abdomen without and with contrast was
recommended for definitive characterization of the left renal
lesion.
CLINICAL DATA: Initial encounter for four-month history of
generalized abdominal pain and 22 pound weight loss

EXAM:
CT ABDOMEN AND PELVIS WITH CONTRAST
TECHNIQUE: Multidetector CT imaging of the abdomen and pelvis was performed
using the standard protocol following bolus administration of
intravenous contrast.
CONTRAST:  125mL K9UAN7-URR IOPAMIDOL ([id number]-URR) INJECTION 61%

[Series 3: abd/pelvis with · axial · 0.98mm/px · z∈[-460,-55]mm · 8 of 105 slices shown, 13 images]
[im 12/105  soft-tissue]
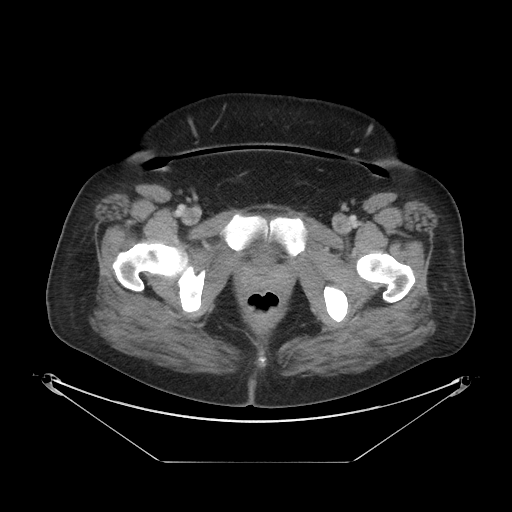
[im 12/105  bone]
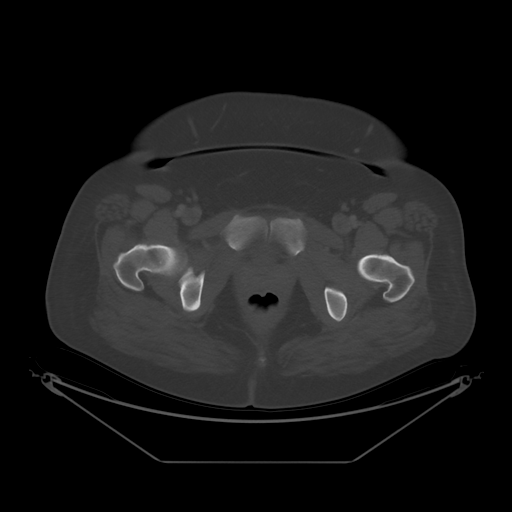
[im 24/105  soft-tissue]
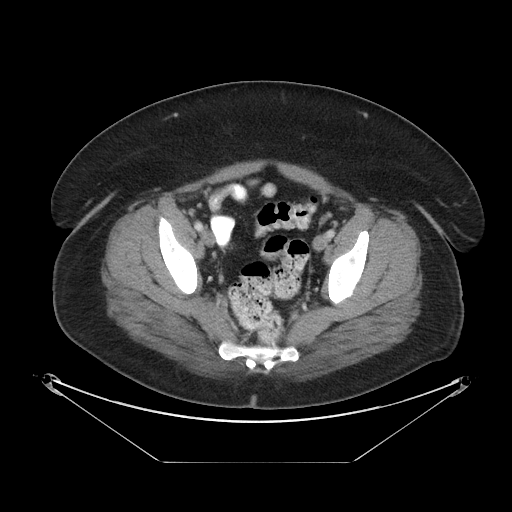
[im 35/105  soft-tissue]
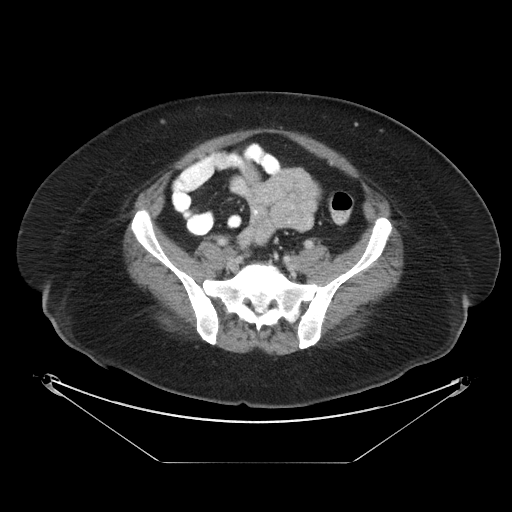
[im 47/105  soft-tissue]
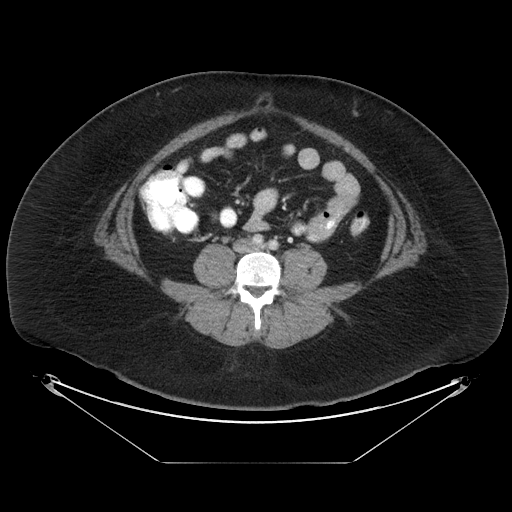
[im 58/105  soft-tissue]
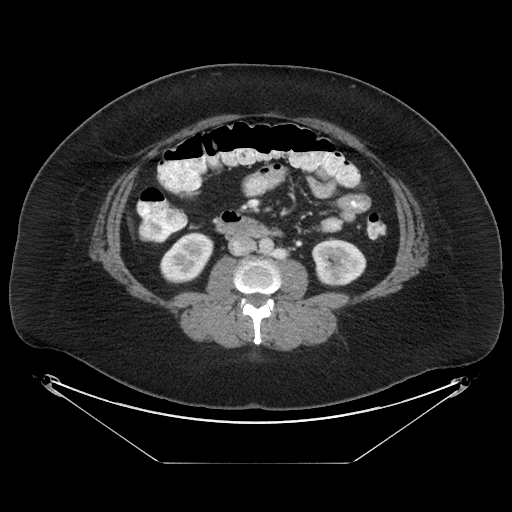
[im 58/105  lung]
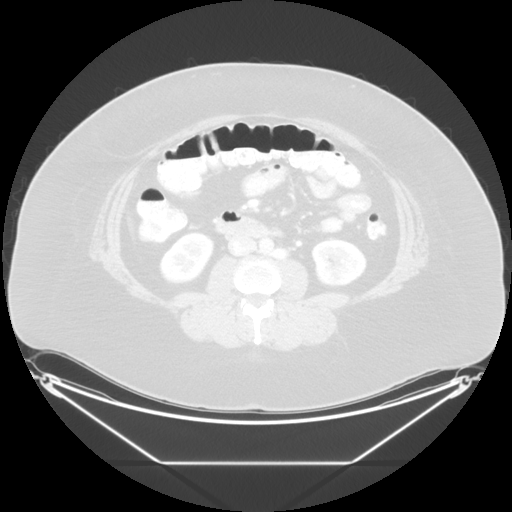
[im 70/105  soft-tissue]
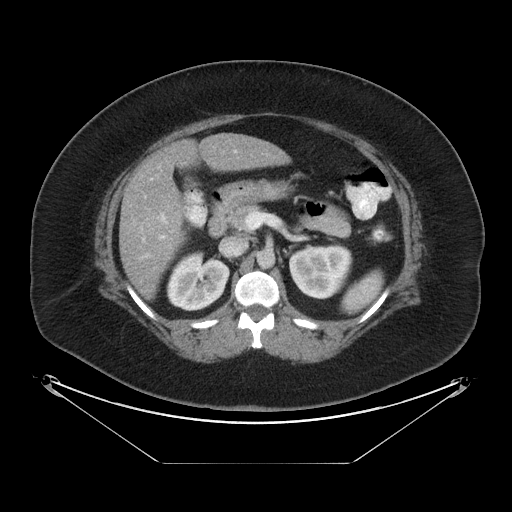
[im 70/105  lung]
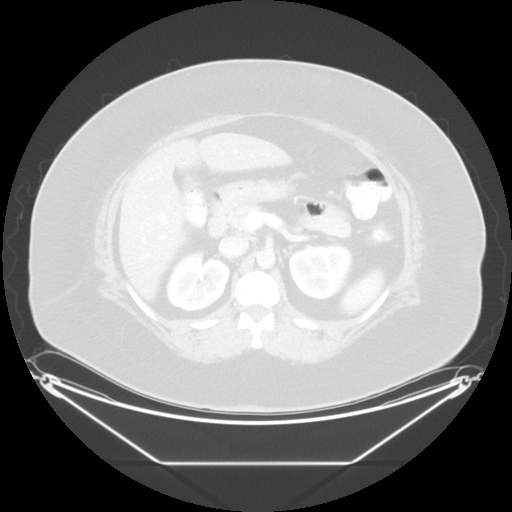
[im 81/105  soft-tissue]
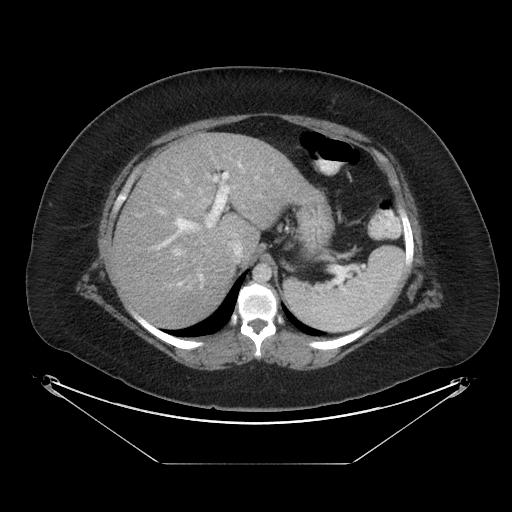
[im 81/105  lung]
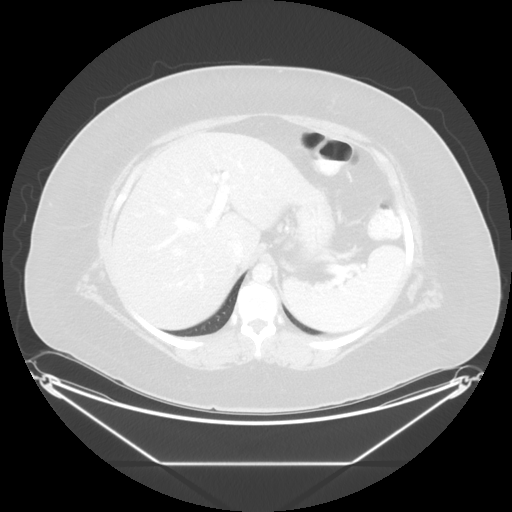
[im 93/105  soft-tissue]
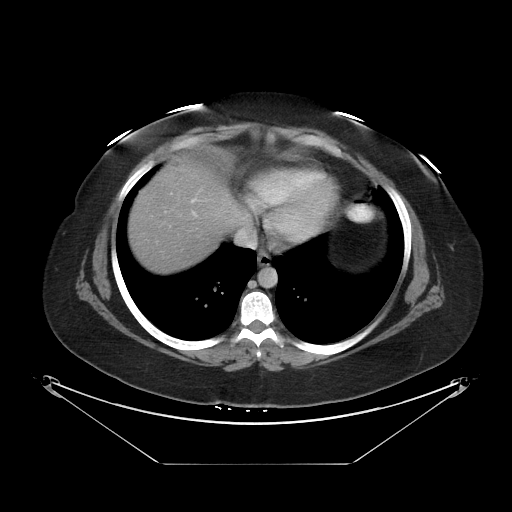
[im 93/105  lung]
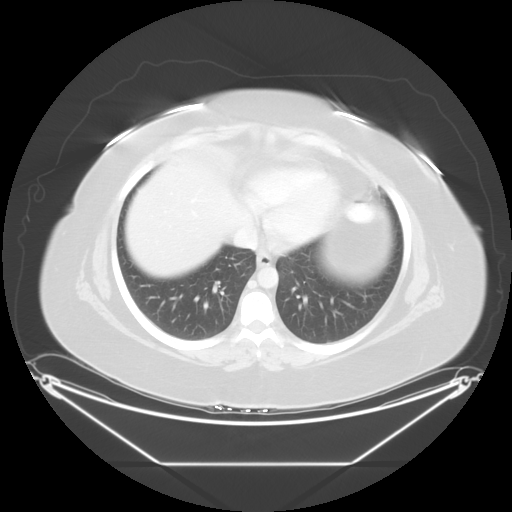

[Series 601: coronal body · coronal · 1.03mm/px · 1 of 160 slices shown, 2 images]
[im 54/160  soft-tissue]
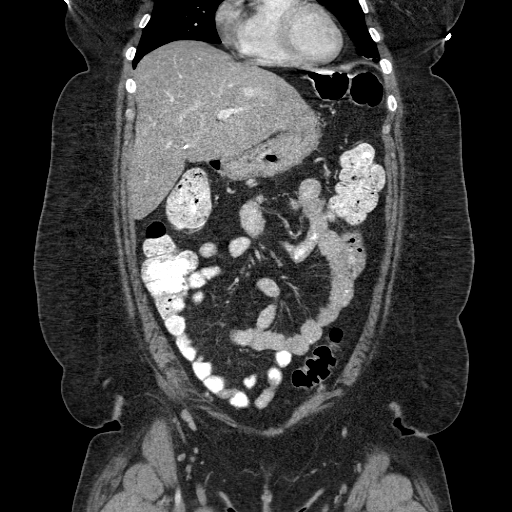
[im 54/160  bone]
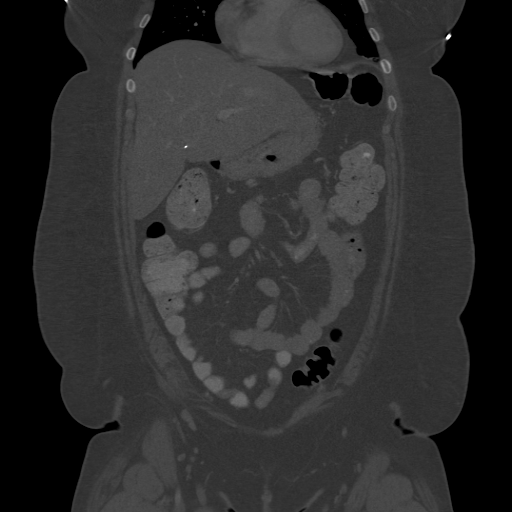

[Series 602: sagittal body · sagittal · 1.03mm/px · 3 of 199 slices shown]
[im 23/199  soft-tissue]
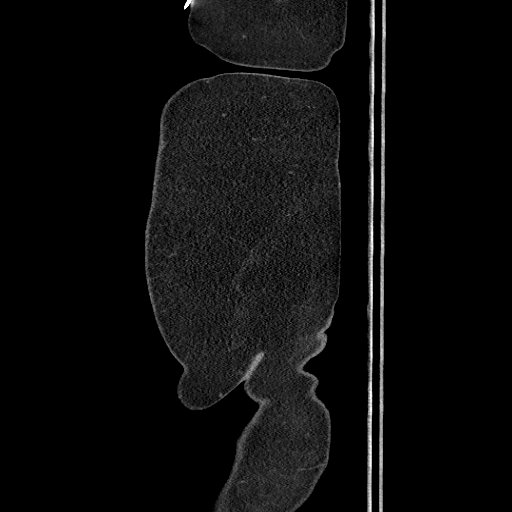
[im 45/199  soft-tissue]
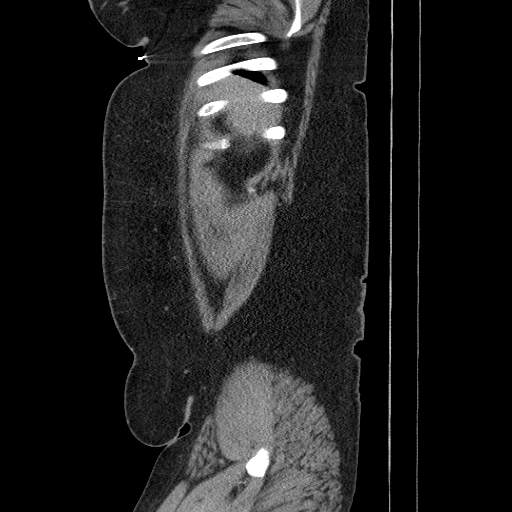
[im 67/199  soft-tissue]
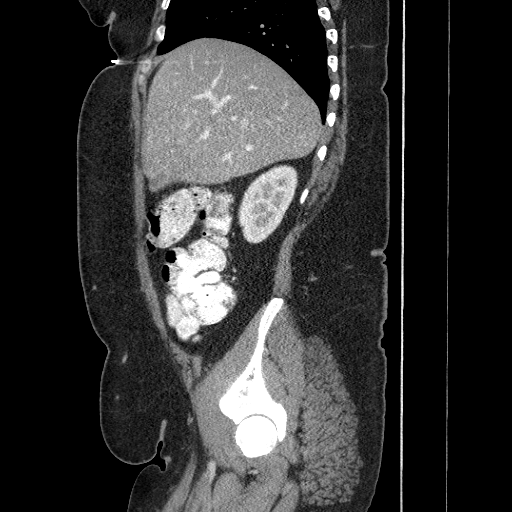

[12 of 36 positions shown; findings below may reference images not displayed]

FINDINGS: Lower chest:  Unremarkable.

Hepatobiliary: No focal abnormality within the liver parenchyma.
Patient is status postcholecystectomy. No intrahepatic or
extrahepatic biliary dilation.

Pancreas: No focal mass lesion. No dilatation of the main duct. No
intraparenchymal cyst. No peripancreatic edema.

Spleen: No splenomegaly. No focal mass lesion.

Adrenals/Urinary Tract: No adrenal nodule or mass. Right kidney is
unremarkable. 1.9 x 2.2 cm hypo attenuating lesion is identified in
the posterior aspect of the interpolar left kidney. This has
attenuation slightly higher than would be expected for a simple
cyst. Reviewing a lumbar spine MRI from 03/11/2005, a 1.1 x 1.2 cm
cystic lesion was identified at this location. There is no hydro
nephrosis. No evidence for hydroureter. The urinary bladder appears
normal for the degree of distention.

Stomach/Bowel: Stomach is nondistended. No gastric wall thickening.
No evidence of outlet obstruction. Duodenum is normally positioned
as is the ligament of Treitz. No small bowel wall thickening. No
small bowel dilatation. The terminal ileum is normal. The appendix
is normal. No gross colonic mass. No colonic wall thickening. No
substantial diverticular change.

Vascular/Lymphatic: No abdominal aortic aneurysm. There is no
gastrohepatic or hepatoduodenal ligament lymphadenopathy. No
intraperitoneal or retroperitoneal lymphadenopy. No mesenteric or
pelvic sidewall lymphadenopathy.

Reproductive: Uterus is surgically absent. There is no adnexal mass.

Other: No intraperitoneal free fluid.

Musculoskeletal: Bone windows reveal no worrisome lytic or sclerotic
osseous lesions. A small infraumbilical midline ventral hernia
contains only omental fat without complicating features.
IMPRESSION: No findings to explain the patient's history of generalized
abdominal pain and weight loss.

2.2 cm hypo attenuating lesion in the left kidney has increased only
slightly since an MRI from 03/11/2005. This long period of relative
stability suggests a benign process and this may represent a cyst
complicated by proteinaceous debris or hemorrhage.

## 2017-06-20 ENCOUNTER — Encounter (HOSPITAL_COMMUNITY): Payer: Self-pay | Admitting: Emergency Medicine

## 2017-06-20 ENCOUNTER — Emergency Department (HOSPITAL_COMMUNITY)
Admission: EM | Admit: 2017-06-20 | Discharge: 2017-06-20 | Disposition: A | Discharge status: Home / Self Care | Payer: Managed Care, Other (non HMO) | Attending: Emergency Medicine | Admitting: Emergency Medicine

## 2017-06-20 DIAGNOSIS — Z79899 Other long term (current) drug therapy: Secondary | ICD-10-CM | POA: Insufficient documentation

## 2017-06-20 DIAGNOSIS — M545 Low back pain: Secondary | ICD-10-CM | POA: Insufficient documentation

## 2017-06-20 DIAGNOSIS — I10 Essential (primary) hypertension: Secondary | ICD-10-CM | POA: Insufficient documentation

## 2017-06-20 MED ORDER — OXYCODONE-ACETAMINOPHEN 5-325 MG PO TABS: 1.0000 | ORAL_TABLET | ORAL | Status: DC | PRN

## 2017-06-20 MED ORDER — METHOCARBAMOL 500 MG PO TABS: 500.0000 mg | ORAL_TABLET | Freq: Every evening | ORAL | 0 refills | Status: DC | PRN

## 2017-06-20 MED ORDER — DICLOFENAC SODIUM 1 % TD GEL: 4.0000 g | Freq: Four times a day (QID) | TRANSDERMAL | 0 refills | Status: DC

## 2017-06-20 NOTE — ED Notes (Signed)
Patient verbalizes understanding of discharge instructions. Opportunity for questioning and answers were provided. Armband removed by staff, pt discharged from ED amvbulatory.

## 2017-06-20 NOTE — ED Provider Notes (Signed)
[facility] EMERGENCY DEPARTMENT Provider Note   CSN: [id number] Arrival date & time: 06/20/17  1203     History   Chief Complaint Chief Complaint  Patient presents with  . Back Pain    HPI Crystal Rojas is a 37 y.o. female with a history of degenerative disc disease, morbid obesity who presents emergency department today for low back pain.  Patient states that over the last 1 month she has been getting progressive worsening low back pain.  She states this started after she went to [employer] and walked around for 5+ hours per day.  She notes that her pain has been progressively getting worse.  Pain is normally worsened when sitting, walking and also bending down to pick things up.  She notes that pain at work, when sitting down for long period of time makes her pain worse.  She commonly gets relief when lying down.  She has been taking nothing for her pain but has tried a cortisone injection by her PCP with mild relief.  She is unable to take steroids or ibuprofen secondary to gastric sleeve surgery. Denies history of cancer, trauma, fever, night pain, IV drug use, upper back pain or neck pain, numbness/tingling/weakness of the lower extremities, urinary retention, loss of bowel/bladder function, saddle anesthesia, or unexplained weight loss.  HPI  Past Medical History:  Diagnosis Date  . Allergy   . Anxiety   . BV (bacterial vaginosis) 05/29/2015  . DDD (degenerative disc disease), lumbar   . Depression   . History of migraine headaches   . Hypertension   . Itching in the vaginal area 05/29/2015  . Morbid obesity (HCC)   . Recurrent cold sores 05/29/2015  . Vaginal discharge 05/29/2015    Patient Active Problem List   Diagnosis Date Noted  . Vaginal discharge 05/29/2015  . Itching in the vaginal area 05/29/2015  . BV (bacterial vaginosis) 05/29/2015  . Recurrent cold sores 05/29/2015  . Generalized anxiety disorder 06/12/2014    Class: Chronic  .  Depression, major, recurrent, moderate (HCC) 06/12/2014    Class: Chronic  . Migraine headache 07/12/2012  . Anxiety state, unspecified 07/12/2012  . Morbid obesity (HCC) 07/12/2012    Past Surgical History:  Procedure Laterality Date  . ABDOMINAL HYSTERECTOMY    . CHOLECYSTECTOMY    . ENDOMETRIAL ABLATION    . LAPAROSCOPIC LYSIS OF ADHESIONS N/A 10/10/2013   Procedure: LAPAROSCOPIC LYSIS OF ADHESIONS;  Surgeon: Lazaro Arms, MD;  Location: [facility];  Service: Gynecology;  Laterality: N/A;  . LAPAROSCOPIC SALPINGO OOPHERECTOMY Left 10/10/2013   Procedure: LAPAROSCOPIC LEFT SALPINGO OOPHORECTOMY;  Surgeon: Lazaro Arms, MD;  Location: [facility];  Service: Gynecology;  Laterality: Left;  . TUBAL LIGATION       OB History    Gravida  4   Para  3   Term      Preterm      AB  1   Living  3     SAB  1   TAB      Ectopic      Multiple      Live Births               Home Medications    Prior to Admission medications   Medication Sig Start Date End Date Taking? Authorizing Provider  cetirizine (ZYRTEC) 10 MG tablet Take 10 mg by mouth at bedtime.    [provider]  metroNIDAZOLE (FLAGYL) 500 MG tablet Take  1 tablet (500 mg total) by mouth 2 (two) times daily. 05/29/15   Adline Potter, NP  valACYclovir (VALTREX) 1000 MG tablet Take 2 now and 2 tonight 05/29/15   Adline Potter, NP    Family History Family History  Problem Relation Age of Onset  . Diabetes Mother   . Hypertension Mother   . Cancer Mother        kidney  . Hypertension Father   . Depression Father   . Diabetes Maternal Grandmother   . COPD Maternal Grandmother   . Depression Paternal Grandmother     Social History Social History   Tobacco Use  . Smoking status: Never Smoker  . Smokeless tobacco: Never Used  Substance Use Topics  . Alcohol use: No  . Drug use: No     Allergies   Patient has no known allergies.   Review of Systems Review of Systems  All other  systems reviewed and are negative.    Physical Exam Updated Vital Signs BP (!) 154/102 (BP Location: Right Arm)   Pulse 75   Temp 98.3 F (36.8 C) (Oral)   Resp 16   Ht  (1.6 m)   Wt 99.8 kg (220 lb)   LMP 09/04/2012   SpO2 98%   BMI 38.97 kg/m   Physical Exam  Constitutional: She appears well-developed and well-nourished. No distress.  Non-toxic appearing  HENT:  Head: Normocephalic and atraumatic.  Right Ear: External ear normal.  Left Ear: External ear normal.  Nose: Nose normal.  Mouth/Throat: Uvula is midline, oropharynx is clear and moist and mucous membranes are normal. No tonsillar exudate.  Eyes: Pupils are equal, round, and reactive to light. Right eye exhibits no discharge. Left eye exhibits no discharge. No scleral icterus.  Neck: Trachea normal and normal range of motion. Neck supple. No spinous process tenderness present. No neck rigidity. Normal range of motion present.  Cardiovascular: Normal rate, regular rhythm, normal heart sounds and intact distal pulses.  No murmur heard. Pulses:      Radial pulses are 2+ on the right side, and 2+ on the left side.       Femoral pulses are 2+ on the right side, and 2+ on the left side.      Dorsalis pedis pulses are 2+ on the right side, and 2+ on the left side.       Posterior tibial pulses are 2+ on the right side, and 2+ on the left side.  No lower extremity swelling or edema. Calves symmetric in size bilaterally.  Pulmonary/Chest: Effort normal and breath sounds normal. No respiratory distress. She exhibits no tenderness.  Abdominal: Soft. Bowel sounds are normal. She exhibits no pulsatile midline mass. There is no tenderness. There is no rigidity, no rebound, no guarding and no CVA tenderness.  Musculoskeletal: She exhibits no edema.  Posterior and appearance appears normal. No evidence of obvious scoliosis or kyphosis. No obvious signs of skin changes, trauma, deformity, infection. No C, T, or L spine tenderness  or step-offs to palpation. No C, T paraspinal tenderness. Right paraspinal ttp. Lung expansion normal. Bilateral lower extremity strength 5 out of 5 including extensor hallucis longus. Patellar and Achilles deep tendon reflex 2+ and equal bilaterally. Sensation of lower extremities grossly intact. Gait able but patient notes painful. Lower extremity compartments soft. PT and DP 2+ b/l. Cap refill <2 seconds.   Lymphadenopathy:    She has no cervical adenopathy.  Neurological: She is alert.  No foot drop  Skin: Skin is warm, dry and intact. Capillary refill takes less than 2 seconds. No rash noted. She is not diaphoretic. No erythema.  No vesicular-like rash noted.  Psychiatric: She has a normal mood and affect.  Nursing note and vitals reviewed.    ED Treatments / Results  Labs (all labs ordered are listed, but only abnormal results are displayed) Labs Reviewed - No data to display  EKG None  Radiology No results found.  Procedures Procedures (including critical care time)  Medications Ordered in ED Medications  oxyCODONE-acetaminophen (PERCOCET/ROXICET) 5-325 MG per tablet 1 tablet (1 tablet Oral Refused 06/20/17 1240)     Initial Impression / Assessment and Plan / ED Course  I have reviewed the triage vital signs and the nursing notes.  Pertinent labs & imaging results that were available during my care of the patient were reviewed by me and considered in my medical decision making (see chart for details).     37 y.o. female with atraumatic back pain. No neurological deficits and normal neuro exam.  Patient can walk but states is painful.  No loss of bowel or bladder control.  No concern for cauda equina.  No fever, night sweats, weight loss, h/o cancer, IVDU.  RICE protocol and pain medicine indicated and discussed with patient.  Will prescribe patient Voltaren given she is unable to take ibuprofen or steroids secondary to gastric sleeve surgery.  Also given muscle relaxers at  bedtime as needed. I advised the patient to follow-up with PCP or orthopedist this week. Specific return precautions discussed. Time was given for all questions to be answered. The patient verbalized understanding and agreement with plan. The patient appears safe for discharge home.  Final Clinical Impressions(s) / ED Diagnoses   Final diagnoses:  Acute right-sided low back pain, with sciatica presence unspecified    ED Discharge Orders        Ordered    methocarbamol (ROBAXIN) 500 MG tablet  At bedtime PRN     06/20/17 1457    diclofenac sodium (VOLTAREN) 1 % GEL  4 times daily     06/20/17 1458       Jacinto Halim, PA-C 06/20/17 1459    Mancel Bale, MD 06/21/17 [phone]

## 2017-06-20 NOTE — ED Triage Notes (Signed)
Pt states hx of back pain. The past month it has progressively gotten worse and she has pain radiating to the left leg. Had an xray done at PCP. Was given cortisone shot with no relief. Not taking any other medications for pain.

## 2017-06-20 NOTE — Discharge Instructions (Signed)
You were seen here today for Back Pain: Low back pain is discomfort in the lower back that may be due to injuries to muscles and ligaments around the spine. Occasionally, it may be caused by a problem to a part of the spine called a disc. Your back pain should be treated with medicines listed below as well as back exercises and this back pain should get better over the next 2 weeks. Most patients get completely well in 4 weeks. It is important to know however, if you develop severe or worsening pain, low back pain with fever, numbness, weakness or inability to walk or urinate, you should return to the ER immediately.  Please follow up with your doctor this week for a recheck if still having symptoms.  HOME INSTRUCTIONS Self - care:  The application of heat can help soothe the pain.  Maintaining your daily activities, including walking (this is encouraged), as it will help you get better faster than just staying in bed. Do not life, push, pull anything more than 10 pounds for the next week. I am attaching back exercises that you can do at home to help facilitate your recovery.   Back Exercises - I have attached a handout on back exercises that can be done at home to help facilitate your recovery.   Medications are also useful to help with pain control.   Acetaminophen.  This medication is generally safe, and found over the counter. Take as directed for your age. You should not take more than 8 of the extra strength ( ) pills a day (max dose is  total OVER one day)  Non steroidal anti inflammatory: If you can not take medications such as ibuprofen and naproxen, medications such as Diclofenac (Voltaren) gel can be applied directly to the area of pain.   Muscle relaxants:  These medications can help with muscle tightness that is a cause of lower back pain.  Most of these medications can cause drowsiness, and it is not safe to drive or use dangerous machinery while taking them. They are primarily  helpful when taken at night before sleep.  You will need to follow up with your primary healthcare provider  or the Orthopedist in 1-2 weeks for reassessment and persistent symptoms.  Be aware that if you develop new symptoms, such as a fever, leg weakness, difficulty with or loss of control of your urine or bowels, abdominal pain, or more severe pain, you will need to seek medical attention and/or return to the Emergency department. Additional Information:  Your vital signs today were: BP (!) 154/102 (BP Location: Right Arm)    Pulse 75    Temp 98.3 F (36.8 C) (Oral)    Resp 16    Ht  (1.6 m)    Wt 99.8 kg (220 lb)    LMP 09/04/2012    SpO2 98%    BMI 38.97 kg/m  If your blood pressure (BP) was elevated above 135/85 this visit, please have this repeated by your doctor within one month. ---------------

## 2017-06-20 NOTE — ED Notes (Signed)
ED Provider at bedside. 

## 2017-10-10 ENCOUNTER — Encounter: Payer: Self-pay | Admitting: *Deleted

## 2017-10-10 ENCOUNTER — Ambulatory Visit (HOSPITAL_COMMUNITY): Payer: 59 | Attending: Family Medicine | Admitting: Physical Therapy

## 2017-10-10 ENCOUNTER — Encounter (HOSPITAL_COMMUNITY): Payer: Self-pay | Admitting: Physical Therapy

## 2017-10-10 ENCOUNTER — Encounter (INDEPENDENT_AMBULATORY_CARE_PROVIDER_SITE_OTHER): Payer: Self-pay

## 2017-10-10 ENCOUNTER — Other Ambulatory Visit: Payer: Self-pay

## 2017-10-10 DIAGNOSIS — R262 Difficulty in walking, not elsewhere classified: Secondary | ICD-10-CM | POA: Diagnosis present

## 2017-10-10 DIAGNOSIS — M5416 Radiculopathy, lumbar region: Secondary | ICD-10-CM | POA: Diagnosis not present

## 2017-10-10 NOTE — Patient Instructions (Addendum)
Scapular Retraction (Standing)    With arms at sides, pinch shoulder blades together. Repeat _10___ times per set. Do _1___ sets per session. Do ___2_ sessions per day.  http://orth.exer.us/944   Copyright  VHI. All rights reserved.  Isometric Abdominal    Lying on back with knees bent, tighten stomach by pulling Rt and LT sides together . Hold __3-5_ seconds. Repeat __5-0__ times per set. Do _1___ sets per session. Do __5__ sessions per day.  http://orth.exer.us/1086   Copyright  VHI. All rights reserved.  While sitting push feet into the floor and raise yourself up as tall as possible.  Repeat 5 to 10 times 3 times a day

## 2017-10-10 NOTE — Therapy (Addendum)
[facility] [address] [place], [place], [address] Phone: [phone]   Fax:  [phone]  Physical Therapy Evaluation  Patient Details  Name: Crystal Rojas MRN: [id number] Date of Birth: 1980/03/07 Referring Provider: Lelon Perla    Encounter Date: 10/10/2017  PT End of Session - 10/10/17 1214    Visit Number  1    Number of Visits  12    Date for PT Re-Evaluation  11/21/17    Authorization - Visit Number  1    Authorization - Number of Visits  60    PT Start Time  0900    PT Stop Time  0945    PT Time Calculation (min)  45 min    Activity Tolerance  Patient tolerated treatment well    Behavior During Therapy  [facility] for tasks assessed/performed       Past Medical History:  Diagnosis Date  . Allergy   . Anxiety   . BV (bacterial vaginosis) 05/29/2015  . DDD (degenerative disc disease), lumbar   . Depression   . History of migraine headaches   . Hypertension   . Itching in the vaginal area 05/29/2015  . Morbid obesity (HCC)   . Recurrent cold sores 05/29/2015  . Vaginal discharge 05/29/2015    Past Surgical History:  Procedure Laterality Date  . ABDOMINAL HYSTERECTOMY    . CHOLECYSTECTOMY    . ENDOMETRIAL ABLATION    . LAPAROSCOPIC LYSIS OF ADHESIONS N/A 10/10/2013   Procedure: LAPAROSCOPIC LYSIS OF ADHESIONS;  Surgeon: Lazaro Arms, MD;  Location: [facility];  Service: Gynecology;  Laterality: N/A;  . LAPAROSCOPIC SALPINGO OOPHERECTOMY Left 10/10/2013   Procedure: LAPAROSCOPIC LEFT SALPINGO OOPHORECTOMY;  Surgeon: Lazaro Arms, MD;  Location: [facility];  Service: Gynecology;  Laterality: Left;  . TUBAL LIGATION      There were no vitals filed for this visit.   Subjective Assessment - 10/10/17 0856    Subjective  PT states that she has been experiencing progressive radicular back pain for years she opted to have surgery on her back 07/29/2017.  She states that since the surgery she is doing a little better walking but she  still has the same amount of pain.  She is now being referred to skilled PT.     Limitations  Sitting;Lifting;Reading;Standing;Walking;House hold activities    How long can you sit comfortably?  10 minutes     How long can you stand comfortably?  5-10 minutees     How long can you walk comfortably?  5 minutes     Patient Stated Goals  improved function and decreased pain.  Improved sleeping:  having difficulty getting to sleep: tosses and turns 2-3 hours prior to sleeping.      Currently in Pain?  Yes    Pain Score  5    Highest is a 9/10 ; least 4/10    Pain Location  Back    Pain Orientation  Left    Pain Descriptors / Indicators  Aching;Burning;Tightness    Pain Type  Chronic pain    Pain Radiating Towards  radiating into her foot; multiple times a day no rhyme or reason     Pain Onset  More than a month ago    Pain Frequency  Constant    Aggravating Factors   sitting     Pain Relieving Factors  lying down, medication     Effect of Pain on Daily Activities  limits  [facility] Assessment - 10/10/17 0001      Assessment   Medical Diagnosis  LT lumbar radiculopathy    Referring Provider  Lelon PerlaHenry Poole     Onset Date/Surgical Date  07/29/17    Next MD Visit  11/09/2017    Prior Therapy  none      Precautions   Precautions  None      Restrictions   Weight Bearing Restrictions  No      Balance Screen   Has the patient fallen in the past 6 months  No    Has the patient had a decrease in activity level because of a fear of falling?   Yes    Is the patient reluctant to leave their home because of a fear of falling?   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     Leisure  none       Cognition   Overall Cognitive Status  Within Functional Limits for tasks assessed      Observation/Other Assessments   Focus on Therapeutic Outcomes (FOTO)   44      Functional Tests   Functional  tests  Single leg stance;Sit to Stand      Single Leg Stance   Comments  RT: 60; Lt: 6       Sit to Stand   Comments  5x 41.21   hands on thighs      Posture/Postural Control   Posture Comments  Rt shoulder is high; hips even       ROM / Strength   AROM / PROM / Strength  AROM;Strength      AROM   AROM Assessment Site  Lumbar    Lumbar Extension  12   no change in pain    Lumbar - Right Side Bend  14   reps increase pain    Lumbar - Left Side Bend  20    reps increase pain      Strength   Strength Assessment Site  Hip;Knee;Ankle    Right/Left Hip  Right;Left    Right Hip Flexion  2+/5    Right Hip Extension  3+/5    Right Hip ABduction  5/5    Left Hip Flexion  2+/5    Left Hip Extension  3-/5    Left Hip ABduction  3-/5    Right/Left Knee  Right;Left    Right Knee Flexion  5/5    Right Knee Extension  5/5    Left Knee Flexion  4/5    Left Knee Extension  4-/5    Right/Left Ankle  Right;Left    Right Ankle Dorsiflexion  4/5    Left Ankle Dorsiflexion  3/5      Ambulation/Gait   Ambulation Distance (Feet)  452 Feet    Assistive device  None    Gait Comments  3' walk test; pt keeps head tilted to side                 Objective measurements completed on examination: See above findings.     Sitting:       Scapular retraction, ab set, sitting as tall as possible all x 10 Standing :  Lumbar extension x 5    PT Education - 10/10/17 1214    Education Details  HEp    Person(s) Educated  Patient  Methods  Explanation    Comprehension  Verbalized understanding       PT Short Term Goals - 10/10/17 1256      PT SHORT TERM GOAL #1   Title  Pt pain to be no greater than a 6/10 to allow pt to be able to sleep withing 1.5 hours of going to sleep     Baseline  2-3    Time  3    Period  Weeks    Status  New    Target Date  10/31/17      PT SHORT TERM GOAL #2   Title  Pt to be able to sit for 30 minutes without experiencing increased pain to allow  pt to eat a meal in comfort.     Time  3    Period  Weeks    Status  New      PT SHORT TERM GOAL #3   Title  PT to be able to stand for 15 minutes in comfort in order to shower without increased pain.     Time  3    Period  Weeks    Status  New      PT SHORT TERM GOAL #4   Title  Pt core and LE strength to be increased 1/2 grade to allow pt to come from sit to stand with ease.     Time  3    Period  Weeks    Status  New        PT Long Term Goals - 10/10/17 1258      PT LONG TERM GOAL #1   Title  Pt pain to be no greater than a 3/10 to allow pt to get to sleep within 45 minutes     Time  6    Period  Weeks    Status  New    Target Date  11/21/17      PT LONG TERM GOAL #2   Title  PT to be able to sit for an hour in comfort for job duties     Time  6    Period  Weeks    Status  New      PT LONG TERM GOAL #3   Title  PT to be able to stand/walk for 45 mintues in comfort to be able to complete shopping tasks.     Time  6    Period  Weeks    Status  New      PT LONG TERM GOAL #4   Title  PT core and mm strength  to be increased 1 grade to be able to go up a flight of steps without increased discomfort     Time  6    Period  Weeks              PLAN:  Ms. Seaberry has had back pain for years with a recent back surgery in June.  She is still having a high amount of pain and decreased activity tolerance therefore she was referred to skilled physical therapy.  Evaluation demonstrates decreased activity tolerance, decreased strength of core and LE mm, decreased lumbar ROM , decreased flexibility, increased pain and postural dysfunction.  Ms. Almanzar will benefit from skillled physical therapy to address these issues and maximize her functional abiltiy.      Rehab Potential  Good    PT Frequency  2x / week    PT Duration  6 weeks    PT  Treatment/Interventions  ADLs/Self Care Home Management;Therapeutic activities;Therapeutic exercise;Balance training;Gait  training;Stair training;Functional mobility training;Patient/family education;Manual techniques    PT Next Visit Plan  Begin beginning stabilization to include bent knee lift, clam, bridge and sidelying abduction.  Progress stabiliztion and core strengthening as well as mobility.     PT Home Exercise Plan  Sitting tall, ab set and scapular retraction     Consulted and Agree with Plan of Care  Patient       Patient will benefit from skilled therapeutic intervention in order to improve the following deficits and impairments:  Decreased activity tolerance, Decreased balance, Decreased mobility, Decreased range of motion, Decreased strength, Difficulty walking, Pain, Postural dysfunction  Visit Diagnosis: Radiculopathy, lumbar region - Plan: PT plan of care cert/re-cert  Difficulty in walking, not elsewhere classified - Plan: PT plan of care cert/re-cert     Problem List Patient Active Problem List   Diagnosis Date Noted  . Vaginal discharge 05/29/2015  . Itching in the vaginal area 05/29/2015  . BV (bacterial vaginosis) 05/29/2015  . Recurrent cold sores 05/29/2015  . Generalized anxiety disorder 06/12/2014    Class: Chronic  . Depression, major, recurrent, moderate (HCC) 06/12/2014    Class: Chronic  . Migraine headache 07/12/2012  . Anxiety state, unspecified 07/12/2012  . Morbid obesity [facility]) 07/12/2012    Virgina Organ, PT CLT [phone] 10/10/2017, 3:39 PM  [facility] [address] [place], [place], [address] Phone: [phone]   Fax:  [phone]  Name: Crystal Rojas MRN: [id number] Date of Birth: 1980/03/21

## 2017-10-13 ENCOUNTER — Ambulatory Visit (HOSPITAL_COMMUNITY): Payer: 59

## 2017-10-13 ENCOUNTER — Encounter (HOSPITAL_COMMUNITY): Payer: Self-pay

## 2017-10-13 DIAGNOSIS — M5416 Radiculopathy, lumbar region: Secondary | ICD-10-CM

## 2017-10-13 DIAGNOSIS — R262 Difficulty in walking, not elsewhere classified: Secondary | ICD-10-CM

## 2017-10-13 NOTE — Therapy (Signed)
[facility] [address] [place], [place], [address] Phone: [phone]   Fax:  [phone]  Physical Therapy Treatment  Patient Details  Name: Crystal Rojas MRN: [id number] Date of Birth: 1980/06/25 Referring Provider: Lelon Perla   Encounter Date: 10/13/2017  PT End of Session - 10/13/17 1008    Visit Number  2    Number of Visits  12    Date for PT Re-Evaluation  11/21/17   Minireassess 10/31/17   Authorization Type  UHC    Authorization Time Period  08/26-->11/21/17    Authorization - Visit Number  2    Authorization - Number of Visits  60    PT Start Time  0950    PT Stop Time  1030    PT Time Calculation (min)  40 min    Activity Tolerance  Patient tolerated treatment well;Patient limited by pain;No increased pain    Behavior During Therapy  WFL for tasks assessed/performed       Past Medical History:  Diagnosis Date  . Allergy   . Anxiety   . BV (bacterial vaginosis) 05/29/2015  . DDD (degenerative disc disease), lumbar   . Depression   . History of migraine headaches   . Hypertension   . Itching in the vaginal area 05/29/2015  . Morbid obesity (HCC)   . Recurrent cold sores 05/29/2015  . Vaginal discharge 05/29/2015    Past Surgical History:  Procedure Laterality Date  . ABDOMINAL HYSTERECTOMY    . CHOLECYSTECTOMY    . ENDOMETRIAL ABLATION    . LAPAROSCOPIC LYSIS OF ADHESIONS N/A 10/10/2013   Procedure: LAPAROSCOPIC LYSIS OF ADHESIONS;  Surgeon: Lazaro Arms, MD;  Location: [facility];  Service: Gynecology;  Laterality: N/A;  . LAPAROSCOPIC SALPINGO OOPHERECTOMY Left 10/10/2013   Procedure: LAPAROSCOPIC LEFT SALPINGO OOPHORECTOMY;  Surgeon: Lazaro Arms, MD;  Location: [facility];  Service: Gynecology;  Laterality: Left;  . TUBAL LIGATION      There were no vitals filed for this visit.  Subjective Assessment - 10/13/17 0954    Subjective  Pt stated she has difficulty sleeping and pain scale 7/10 Lt side of lower  back.  Reports compliance with HEP and has began a walking program, soreness today.    Patient Stated Goals  improved function and decreased pain.  Improved sleeping:  having difficulty getting to sleep: tosses and turns 2-3 hours prior to sleeping.      Currently in Pain?  Yes    Pain Score  7     Pain Location  Back    Pain Orientation  Left;Lower    Pain Descriptors / Indicators  Sore;Aching;Burning    Pain Type  Chronic pain    Pain Radiating Towards  No current radicular symptoms today (does have radiating into her Lt foot; multiple times a day no rhyme or reason)    Pain Onset  More than a month ago    Pain Frequency  Constant    Aggravating Factors   sitting    Pain Relieving Factors  lying down, medication    Effect of Pain on Daily Activities  limits         [facility] PT Assessment - 10/13/17 0001      Assessment   Medical Diagnosis  LT lumbar radiculopathy    Referring Provider  Lelon Perla    Onset Date/Surgical Date  07/29/17    Next MD Visit  11/09/2017    Prior Therapy  none  Precautions   Precautions  None    Precaution Comments  rev                   [facility] Adult PT Treatment/Exercise - 10/13/17 0001      Exercises   Exercises  Lumbar      Lumbar Exercises: Seated   Other Seated Lumbar Exercises  scapular retraction, ab set; sitting tall x 10 reps       Lumbar Exercises: Supine   Ab Set  10 reps    AB Set Limitations  min tactile and verbal cueing for TrA activaiton    Clam  10 reps;3 seconds    Clam Limitations  RTB    Bent Knee Raise  10 reps;3 seconds    Bent Knee Raise Limitations  cueing for breathing     Bridge  10 reps      Lumbar Exercises: Sidelying   Hip Abduction  10 reps             PT Education - 10/13/17 1008    Education Details  Reviewed goals, assured compliance iwth HEP and copy of eval given to pt.  Reviewed body mechanics for bed mobility.  Educated on purpose of exercises for strengthening and lumbar  stability,    Person(s) Educated  Patient    Methods  Demonstration;Explanation;Handout    Comprehension  Verbalized understanding;Returned demonstration       PT Short Term Goals - 10/10/17 1256      PT SHORT TERM GOAL #1   Title  Pt pain to be no greater than a 6/10 to allow pt to be able to sleep withing 1.5 hours of going to sleep     Baseline  2-3    Time  3    Period  Weeks    Status  New    Target Date  10/31/17      PT SHORT TERM GOAL #2   Title  Pt to be able to sit for 30 minutes without experiencing increased pain to allow pt to eat a meal in comfort.     Time  3    Period  Weeks    Status  New      PT SHORT TERM GOAL #3   Title  PT to be able to stand for 15 minutes in comfort in order to shower without increased pain.     Time  3    Period  Weeks    Status  New      PT SHORT TERM GOAL #4   Title  Pt core and LE strength to be increased 1/2 grade to allow pt to come from sit to stand with ease.     Time  3    Period  Weeks    Status  New        PT Long Term Goals - 10/10/17 1258      PT LONG TERM GOAL #1   Title  Pt pain to be no greater than a 3/10 to allow pt to get to sleep within 45 minutes     Time  6    Period  Weeks    Status  New    Target Date  11/21/17      PT LONG TERM GOAL #2   Title  PT to be able to sit for an hour in comfort for job duties     Time  6    Period  Weeks    Status  New      PT LONG TERM GOAL #3   Title  PT to be able to stand/walk for 45 mintues in comfort to be able to complete shopping tasks.     Time  6    Period  Weeks    Status  New      PT LONG TERM GOAL #4   Title  PT core and mm strength  to be increased 1 grade to be able to go up a flight of steps without increased discomfort     Time  6    Period  Weeks            Plan - 10/13/17 1020    Clinical Impression Statement  Reviewed goals, assured compliance and proper form with HEP and copy of eval given to pt.  Reviewed bed mobility mechanics with  good form demonstrated.  Session focus with core strengthening for lumbar stability.  Pt able to demontrate appropriate form and mechancis with min cueing for stability and reminders to breath.  No reports of increased pain thruogh session.      Rehab Potential  Good    PT Frequency  2x / week    PT Duration  6 weeks    PT Treatment/Interventions  ADLs/Self Care Home Management;Therapeutic activities;Therapeutic exercise;Balance training;Gait training;Stair training;Functional mobility training;Patient/family education;Manual techniques    PT Next Visit Plan  Continue lumbar stabilization exercises.  Progress to sidelying clam next session and progress to standing as able. Progress stabiliztion and core strengthening as well as mobility.     PT Home Exercise Plan  Sitting tall, ab set and scapular retraction        Patient will benefit from skilled therapeutic intervention in order to improve the following deficits and impairments:  Decreased activity tolerance, Decreased balance, Decreased mobility, Decreased range of motion, Decreased strength, Difficulty walking, Pain, Postural dysfunction  Visit Diagnosis: Radiculopathy, lumbar region  Difficulty in walking, not elsewhere classified     Problem List Patient Active Problem List   Diagnosis Date Noted  . Vaginal discharge 05/29/2015  . Itching in the vaginal area 05/29/2015  . BV (bacterial vaginosis) 05/29/2015  . Recurrent cold sores 05/29/2015  . Generalized anxiety disorder 06/12/2014    Class: Chronic  . Depression, major, recurrent, moderate (HCC) 06/12/2014    Class: Chronic  . Migraine headache 07/12/2012  . Anxiety state, unspecified 07/12/2012  . Morbid obesity (HCC) 07/12/2012   Becky Sax, LPTA; CBIS [phone]  Juel Burrow 10/13/2017, 11:47 AM  [facility] [address] [place], [place], [address] Phone: [phone]   Fax:  [phone]  Name: Crystal Rojas MRN: [id number] Date of Birth: [birth year]

## 2017-10-18 ENCOUNTER — Telehealth (HOSPITAL_COMMUNITY): Payer: Self-pay

## 2017-10-18 ENCOUNTER — Ambulatory Visit (HOSPITAL_COMMUNITY): Payer: 59

## 2017-10-18 NOTE — Telephone Encounter (Signed)
Patient is not feeling well and will not be coming in today °

## 2017-10-18 NOTE — Telephone Encounter (Signed)
Patient is not feeling well and will not be coming in today

## 2017-10-20 ENCOUNTER — Encounter (HOSPITAL_COMMUNITY): Payer: Self-pay

## 2017-10-20 ENCOUNTER — Ambulatory Visit (HOSPITAL_COMMUNITY): Payer: 59 | Attending: Family Medicine

## 2017-10-20 DIAGNOSIS — R262 Difficulty in walking, not elsewhere classified: Secondary | ICD-10-CM | POA: Diagnosis present

## 2017-10-20 DIAGNOSIS — M5416 Radiculopathy, lumbar region: Secondary | ICD-10-CM | POA: Diagnosis not present

## 2017-10-20 NOTE — Therapy (Signed)
[facility] [address] [place], [place], [address] Phone: [phone]   Fax:  [phone]  Physical Therapy Treatment  Patient Details  Name: TENIYA MATRAS MRN: [id number] Date of Birth: 02/17/1980 Referring Provider: Lelon Perla   Encounter Date: 10/20/2017  PT End of Session - 10/20/17 1000    Visit Number  3    Number of Visits  12    Date for PT Re-Evaluation  11/21/17   Minireassess 10/31/17   Authorization Type  UHC    Authorization Time Period  08/26-->11/21/17    Authorization - Visit Number  3    Authorization - Number of Visits  60    PT Start Time  0952    PT Stop Time  1033    PT Time Calculation (min)  41 min    Activity Tolerance  Patient tolerated treatment well;Patient limited by pain;No increased pain    Behavior During Therapy  WFL for tasks assessed/performed       Past Medical History:  Diagnosis Date  . Allergy   . Anxiety   . BV (bacterial vaginosis) 05/29/2015  . DDD (degenerative disc disease), lumbar   . Depression   . History of migraine headaches   . Hypertension   . Itching in the vaginal area 05/29/2015  . Morbid obesity (HCC)   . Recurrent cold sores 05/29/2015  . Vaginal discharge 05/29/2015    Past Surgical History:  Procedure Laterality Date  . ABDOMINAL HYSTERECTOMY    . CHOLECYSTECTOMY    . ENDOMETRIAL ABLATION    . LAPAROSCOPIC LYSIS OF ADHESIONS N/A 10/10/2013   Procedure: LAPAROSCOPIC LYSIS OF ADHESIONS;  Surgeon: Lazaro Arms, MD;  Location: [facility];  Service: Gynecology;  Laterality: N/A;  . LAPAROSCOPIC SALPINGO OOPHERECTOMY Left 10/10/2013   Procedure: LAPAROSCOPIC LEFT SALPINGO OOPHORECTOMY;  Surgeon: Lazaro Arms, MD;  Location: [facility];  Service: Gynecology;  Laterality: Left;  . TUBAL LIGATION      There were no vitals filed for this visit.  Subjective Assessment - 10/20/17 0955    Subjective  Pt stated she has done more house work and increased soreness.  Continues  compliance iwht HEP and walking program.  Current pain scale 6/10 LBP    Patient Stated Goals  improved function and decreased pain.  Improved sleeping:  having difficulty getting to sleep: tosses and turns 2-3 hours prior to sleeping.      Currently in Pain?  Yes    Pain Score  6     Pain Location  Back    Pain Orientation  Lower;Left    Pain Descriptors / Indicators  Sore;Aching    Pain Type  Chronic pain    Pain Radiating Towards  Lt LE ending at knee (does have radiaing into her Lt foot; multiple times a day no rhyme or reason    Pain Onset  More than a month ago    Pain Frequency  Constant    Aggravating Factors   sitting    Pain Relieving Factors  lying down, medication    Effect of Pain on Daily Activities  limits         [facility] PT Assessment - 10/20/17 0001      Assessment   Medical Diagnosis  LT lumbar radiculopathy    Referring Provider  Lelon Perla    Onset Date/Surgical Date  07/29/17    Next MD Visit  11/09/2017    Prior Therapy  none  Precautions   Precautions  None                   [facility] Adult PT Treatment/Exercise - 10/20/17 0001      Exercises   Exercises  Lumbar      Lumbar Exercises: Stretches   Active Hamstring Stretch  Right;Left;2 reps;30 seconds    Single Knee to Chest Stretch  2 reps;30 seconds    Standing Extension  5 reps      Lumbar Exercises: Supine   Ab Set  10 reps    AB Set Limitations  min tactile and verbal cueing for TrA activaiton    Dead Bug  10 reps;3 seconds    Bridge  10 reps;3 seconds      Lumbar Exercises: Sidelying   Clam  Both;10 reps;3 seconds    Hip Abduction  10 reps               PT Short Term Goals - 10/10/17 1256      PT SHORT TERM GOAL #1   Title  Pt pain to be no greater than a 6/10 to allow pt to be able to sleep withing 1.5 hours of going to sleep     Baseline  2-3    Time  3    Period  Weeks    Status  New    Target Date  10/31/17      PT SHORT TERM GOAL #2   Title  Pt to be  able to sit for 30 minutes without experiencing increased pain to allow pt to eat a meal in comfort.     Time  3    Period  Weeks    Status  New      PT SHORT TERM GOAL #3   Title  PT to be able to stand for 15 minutes in comfort in order to shower without increased pain.     Time  3    Period  Weeks    Status  New      PT SHORT TERM GOAL #4   Title  Pt core and LE strength to be increased 1/2 grade to allow pt to come from sit to stand with ease.     Time  3    Period  Weeks    Status  New        PT Long Term Goals - 10/10/17 1258      PT LONG TERM GOAL #1   Title  Pt pain to be no greater than a 3/10 to allow pt to get to sleep within 45 minutes     Time  6    Period  Weeks    Status  New    Target Date  11/21/17      PT LONG TERM GOAL #2   Title  PT to be able to sit for an hour in comfort for job duties     Time  6    Period  Weeks    Status  New      PT LONG TERM GOAL #3   Title  PT to be able to stand/walk for 45 mintues in comfort to be able to complete shopping tasks.     Time  6    Period  Weeks    Status  New      PT LONG TERM GOAL #4   Title  PT core and mm strength  to be increased 1 grade to be able  to go up a flight of steps without increased discomfort     Time  6    Period  Weeks            Plan - 10/20/17 1022    Clinical Impression Statement  Session focus on core and proximal strengthening for lumbar stability.  Progressed to dead bug for core stability and sidelying clam for gluteal strengthening.  Added stretches for mobility to assist with pain control.  Pt reports slight increase in pain by 1 gread, stated she feels is productive soreness from new exercises.  Reports decreased radicular symptoms in intensity and ended at knee rather than to feet.    Rehab Potential  Good    PT Frequency  2x / week    PT Duration  6 weeks    PT Treatment/Interventions  ADLs/Self Care Home Management;Therapeutic activities;Therapeutic exercise;Balance  training;Gait training;Stair training;Functional mobility training;Patient/family education;Manual techniques    PT Next Visit Plan  Progress stabiliztion and core strengthening as well as mobility.     PT Home Exercise Plan  Sitting tall, ab set and scapular retraction; 10/20/17: hamstring stretch and clam       Patient will benefit from skilled therapeutic intervention in order to improve the following deficits and impairments:  Decreased activity tolerance, Decreased balance, Decreased mobility, Decreased range of motion, Decreased strength, Difficulty walking, Pain, Postural dysfunction  Visit Diagnosis: Radiculopathy, lumbar region  Difficulty in walking, not elsewhere classified     Problem List Patient Active Problem List   Diagnosis Date Noted  . Vaginal discharge 05/29/2015  . Itching in the vaginal area 05/29/2015  . BV (bacterial vaginosis) 05/29/2015  . Recurrent cold sores 05/29/2015  . Generalized anxiety disorder 06/12/2014    Class: Chronic  . Depression, major, recurrent, moderate (HCC) 06/12/2014    Class: Chronic  . Migraine headache 07/12/2012  . Anxiety state, unspecified 07/12/2012  . Morbid obesity (HCC) 07/12/2012    Juel Burrow 10/20/2017, 12:04 PM  [facility] [address] [place], [place], [address] Phone: [phone]   Fax:  [phone]  Name: JILENE SPOHR MRN: [id number] Date of Birth: 15-Dec-1980

## 2017-10-20 NOTE — Patient Instructions (Addendum)
Hamstring: Towel Stretch (Supine)    Lie on back. Loop towel around left foot, hip and knee at 90. Straighten knee and pull foot toward body. Hold 30 seconds. Relax. Repeat 3 times. Do 2 times a day. Repeat with other leg.    Copyright  VHI. All rights reserved.    Abduction: Clam (Eccentric) - Side-Lying    Lie on side with knees bent. Lift top knee, keeping feet together. Keep trunk steady. Slowly lower for 3-5 seconds. 10 reps per set, 1-2 sets per day, 4 days per week.  http://ecce.exer.us/65   Copyright  VHI. All rights reserved.

## 2017-10-24 ENCOUNTER — Encounter (HOSPITAL_COMMUNITY): Payer: Self-pay | Admitting: Physical Therapy

## 2017-10-25 ENCOUNTER — Other Ambulatory Visit: Payer: Self-pay

## 2017-10-25 ENCOUNTER — Ambulatory Visit (HOSPITAL_COMMUNITY): Payer: 59 | Admitting: Physical Therapy

## 2017-10-25 ENCOUNTER — Encounter (HOSPITAL_COMMUNITY): Payer: Self-pay | Admitting: Physical Therapy

## 2017-10-25 DIAGNOSIS — M5416 Radiculopathy, lumbar region: Secondary | ICD-10-CM

## 2017-10-25 DIAGNOSIS — R262 Difficulty in walking, not elsewhere classified: Secondary | ICD-10-CM

## 2017-10-25 NOTE — Patient Instructions (Addendum)
Bridging    Slowly raise buttocks from floor, keeping stomach tight. Repeat __15__ times per set. Do _1___ sets per session. Do 2____ sessions per day.  http://orth.exer.us/1096   Copyright  VHI. All rights reserved.  Bent Leg Lift (Hook-Lying)    Tighten stomach and slowly raise right leg _3___ inches from floor. Keep trunk rigid. Hold _3___ seconds. Repeat __10__ times per set. Do ___1_ sets per session. Do __2__ sessions per day.  http://orth.exer.us/1090   Copyright  VHI. All rights reserved.  Functional Quadriceps: Chair Squat    Keeping feet flat on floor, shoulder width apart, squat as low as is comfortable. Use support as necessary. Repeat _10___ times per set. Do _1___ sets per session. Do _2___ sessions per day.  http://orth.exer.us/736   Copyright  VHI. All rights reserved.  Heel Raise: Bilateral (Standing)    Rise on balls of feet. Repeat 10____ times per set. Do _1___ sets per session. Do __2__ sessions per day.  http://orth.exer.us/38   Copyright  VHI. All rights reserved.

## 2017-10-25 NOTE — Therapy (Signed)
[facility] [address] [place], [place], [address] Phone: [phone]   Fax:  [phone]  Physical Therapy Treatment  Patient Details  Name: Crystal Rojas MRN: [id number] Date of Birth: 09-Oct-1980 Referring Provider: Lelon Perla   Encounter Date: 10/25/2017  PT End of Session - 10/25/17 1013    Visit Number  4    Number of Visits  12    Date for PT Re-Evaluation  11/21/17   Minireassess 10/31/17   Authorization Type  UHC    Authorization Time Period  08/26-->11/21/17    Authorization - Visit Number  4    Authorization - Number of Visits  60    PT Start Time  0945    PT Stop Time  1025    PT Time Calculation (min)  40 min    Activity Tolerance  Patient tolerated treatment well;Patient limited by pain;No increased pain    Behavior During Therapy  WFL for tasks assessed/performed       Past Medical History:  Diagnosis Date  . Allergy   . Anxiety   . BV (bacterial vaginosis) 05/29/2015  . DDD (degenerative disc disease), lumbar   . Depression   . History of migraine headaches   . Hypertension   . Itching in the vaginal area 05/29/2015  . Morbid obesity (HCC)   . Recurrent cold sores 05/29/2015  . Vaginal discharge 05/29/2015    Past Surgical History:  Procedure Laterality Date  . ABDOMINAL HYSTERECTOMY    . CHOLECYSTECTOMY    . ENDOMETRIAL ABLATION    . LAPAROSCOPIC LYSIS OF ADHESIONS N/A 10/10/2013   Procedure: LAPAROSCOPIC LYSIS OF ADHESIONS;  Surgeon: Lazaro Arms, MD;  Location: [facility];  Service: Gynecology;  Laterality: N/A;  . LAPAROSCOPIC SALPINGO OOPHERECTOMY Left 10/10/2013   Procedure: LAPAROSCOPIC LEFT SALPINGO OOPHORECTOMY;  Surgeon: Lazaro Arms, MD;  Location: [facility];  Service: Gynecology;  Laterality: Left;  . TUBAL LIGATION      There were no vitals filed for this visit.  Subjective Assessment - 10/25/17 0943    Subjective  PT states that she is just sore she is not having any pain at this time.     Limitations  Sitting;Lifting;Reading;Standing;Walking;House hold activities    How long can you sit comfortably?  10 minutes     How long can you stand comfortably?  5-10 minutees     How long can you walk comfortably?  5 minutes     Patient Stated Goals  improved function and decreased pain.  Improved sleeping:  having difficulty getting to sleep: tosses and turns 2-3 hours prior to sleeping.      Currently in Pain?  No/denies    Pain Onset  More than a month ago                       [facility] Adult PT Treatment/Exercise - 10/25/17 0001      Exercises   Exercises  Lumbar      Lumbar Exercises: Stretches   Passive Hamstring Stretch  Right;Left;3 reps;30 seconds    Other Lumbar Stretch Exercise  hip 3 D excursion x 3       Lumbar Exercises: Standing   Heel Raises  10 reps    Functional Squats  10 reps    Forward Lunge  5 reps    Scapular Retraction  Strengthening;Both;10 reps;Theraband    Theraband Level (Scapular Retraction)  Level 3 (Green)    Row  Strengthening;Both;10 reps;Theraband    Theraband Level (Row)  Level 3 (Green)    Shoulder Extension  Strengthening;Both;10 reps;Theraband    Theraband Level (Shoulder Extension)  Level 3 (Green)      Lumbar Exercises: Seated   Sit to Stand  10 reps      Lumbar Exercises: Supine   Bridge  15 reps    Straight Leg Raise  15 reps      Lumbar Exercises: Sidelying   Clam  Both;10 reps    Hip Abduction  Both;10 reps             PT Education - 10/25/17 1019    Education Details  updated HEP     Person(s) Educated  Patient    Methods  Explanation    Comprehension  Verbalized understanding       PT Short Term Goals - 10/10/17 1256      PT SHORT TERM GOAL #1   Title  Pt pain to be no greater than a 6/10 to allow pt to be able to sleep withing 1.5 hours of going to sleep     Baseline  2-3    Time  3    Period  Weeks    Status  New    Target Date  10/31/17      PT SHORT TERM GOAL #2   Title  Pt to be  able to sit for 30 minutes without experiencing increased pain to allow pt to eat a meal in comfort.     Time  3    Period  Weeks    Status  New      PT SHORT TERM GOAL #3   Title  PT to be able to stand for 15 minutes in comfort in order to shower without increased pain.     Time  3    Period  Weeks    Status  New      PT SHORT TERM GOAL #4   Title  Pt core and LE strength to be increased 1/2 grade to allow pt to come from sit to stand with ease.     Time  3    Period  Weeks    Status  New        PT Long Term Goals - 10/10/17 1258      PT LONG TERM GOAL #1   Title  Pt pain to be no greater than a 3/10 to allow pt to get to sleep within 45 minutes     Time  6    Period  Weeks    Status  New    Target Date  11/21/17      PT LONG TERM GOAL #2   Title  PT to be able to sit for an hour in comfort for job duties     Time  6    Period  Weeks    Status  New      PT LONG TERM GOAL #3   Title  PT to be able to stand/walk for 45 mintues in comfort to be able to complete shopping tasks.     Time  6    Period  Weeks    Status  New      PT LONG TERM GOAL #4   Title  PT core and mm strength  to be increased 1 grade to be able to go up a flight of steps without increased discomfort     Time  6  Period  Weeks            Plan - 10/25/17 1014    Clinical Impression Statement  Added standing stabilization to program for functional tasks.  PT tends to rely on Y ligament instead of postural mm to stay upright.  Corrected pt and explained the importance of learning how to use her postural mm again.     Rehab Potential  Good    PT Frequency  2x / week    PT Duration  6 weeks    PT Treatment/Interventions  ADLs/Self Care Home Management;Therapeutic activities;Therapeutic exercise;Balance training;Gait training;Stair training;Functional mobility training;Patient/family education;Manual techniques    PT Next Visit Plan  Progress stabiliztion and core strengthening as well as  mobility. Once pt is able to complete standing postrual exercises with good technique give as a HEP     PT Home Exercise Plan  Sitting tall, ab set and scapular retraction; 10/20/17: hamstring stretch and clam       Patient will benefit from skilled therapeutic intervention in order to improve the following deficits and impairments:  Decreased activity tolerance, Decreased balance, Decreased mobility, Decreased range of motion, Decreased strength, Difficulty walking, Pain, Postural dysfunction  Visit Diagnosis: Radiculopathy, lumbar region  Difficulty in walking, not elsewhere classified     Problem List Patient Active Problem List   Diagnosis Date Noted  . Vaginal discharge 05/29/2015  . Itching in the vaginal area 05/29/2015  . BV (bacterial vaginosis) 05/29/2015  . Recurrent cold sores 05/29/2015  . Generalized anxiety disorder 06/12/2014    Class: Chronic  . Depression, major, recurrent, moderate (HCC) 06/12/2014    Class: Chronic  . Migraine headache 07/12/2012  . Anxiety state, unspecified 07/12/2012  . Morbid obesity [facility]) 07/12/2012  Virgina Organ, PT CLT [phone] 10/25/2017, 10:27 AM  [facility] [address] [place], [place], [address] Phone: [phone]   Fax:  [phone]  Name: JAVAYAH MAGAW MRN: [id number] Date of Birth: 04/03/1980

## 2017-10-27 ENCOUNTER — Ambulatory Visit (HOSPITAL_COMMUNITY): Payer: 59 | Admitting: Physical Therapy

## 2017-10-27 DIAGNOSIS — M5416 Radiculopathy, lumbar region: Secondary | ICD-10-CM

## 2017-10-27 DIAGNOSIS — R262 Difficulty in walking, not elsewhere classified: Secondary | ICD-10-CM

## 2017-10-27 NOTE — Therapy (Signed)
[facility] [address] [place], [place], [address] Phone: [phone]   Fax:  [phone]  Physical Therapy Treatment  Patient Details  Name: Crystal Rojas MRN: [id number] Date of Birth: August 15, 1980 Referring Provider: Lelon Perla   Encounter Date: 10/27/2017  PT End of Session - 10/27/17 1042    Visit Number  5    Number of Visits  12    Date for PT Re-Evaluation  11/21/17   Minireassess 10/31/17   Authorization Type  UHC    Authorization Time Period  08/26-->11/21/17    Authorization - Visit Number  5    Authorization - Number of Visits  60    PT Start Time  1036    PT Stop Time  1120    PT Time Calculation (min)  44 min    Activity Tolerance  Patient tolerated treatment well;Patient limited by pain;No increased pain    Behavior During Therapy  WFL for tasks assessed/performed       Past Medical History:  Diagnosis Date  . Allergy   . Anxiety   . BV (bacterial vaginosis) 05/29/2015  . DDD (degenerative disc disease), lumbar   . Depression   . History of migraine headaches   . Hypertension   . Itching in the vaginal area 05/29/2015  . Morbid obesity (HCC)   . Recurrent cold sores 05/29/2015  . Vaginal discharge 05/29/2015    Past Surgical History:  Procedure Laterality Date  . ABDOMINAL HYSTERECTOMY    . CHOLECYSTECTOMY    . ENDOMETRIAL ABLATION    . LAPAROSCOPIC LYSIS OF ADHESIONS N/A 10/10/2013   Procedure: LAPAROSCOPIC LYSIS OF ADHESIONS;  Surgeon: Lazaro Arms, MD;  Location: [facility];  Service: Gynecology;  Laterality: N/A;  . LAPAROSCOPIC SALPINGO OOPHERECTOMY Left 10/10/2013   Procedure: LAPAROSCOPIC LEFT SALPINGO OOPHORECTOMY;  Surgeon: Lazaro Arms, MD;  Location: [facility];  Service: Gynecology;  Laterality: Left;  . TUBAL LIGATION      There were no vitals filed for this visit.  Subjective Assessment - 10/27/17 1026    Subjective  PT has no complaints     Limitations   Sitting;Lifting;Reading;Standing;Walking;House hold activities    How long can you sit comfortably?  10 minutes     How long can you stand comfortably?  5-10 minutees     How long can you walk comfortably?  5 minutes     Patient Stated Goals  improved function and decreased pain.  Improved sleeping:  having difficulty getting to sleep: tosses and turns 2-3 hours prior to sleeping.      Currently in Pain?  Yes    Pain Score  5     Pain Location  Back    Pain Orientation  Lower    Pain Descriptors / Indicators  Sore    Pain Onset  More than a month ago    Pain Frequency  Intermittent                       [facility] Adult PT Treatment/Exercise - 10/27/17 0001      Exercises   Exercises  Lumbar      Lumbar Exercises: Stretches   Passive Hamstring Stretch  Right;Left;3 reps;30 seconds    Other Lumbar Stretch Exercise  hip 3 D excursion x 3       Lumbar Exercises: Standing   Heel Raises  15 reps    Functional Squats  10 reps    Forward Lunge  5 reps    Scapular Retraction  Strengthening;Both;15 reps;Theraband    Theraband Level (Scapular Retraction)  Level 3 (Green)    Row  Strengthening;Both;15 reps;Theraband    Theraband Level (Row)  Level 3 (Green)    Shoulder Extension  Strengthening;Both;15 reps;Theraband    Theraband Level (Shoulder Extension)  Level 3 (Green)    Other Standing Lumbar Exercises  Y lift off x 10       Lumbar Exercises: Seated   Sit to Stand  10 reps      Lumbar Exercises: Supine   Bridge  2 reps 45 second hold    Straight Leg Raise  15 reps      Lumbar Exercises: Sidelying   Clam  Both;10 reps    Hip Abduction  Both;5 reps;15 reps      Lumbar Exercises: Prone   Straight Leg Raise  5 reps    Other Prone Lumbar Exercises  B shoulder extension x 10                PT Short Term Goals - 10/27/17 1103      PT SHORT TERM GOAL #1   Title  Pt pain to be no greater than a 6/10 to allow pt to be able to sleep within 1.5 hours of going to  sleep     Baseline  2-3    Time  3    Period  Weeks    Status  On-going      PT SHORT TERM GOAL #2   Title  Pt to be able to sit for 30 minutes without experiencing increased pain to allow pt to eat a meal in comfort.     Time  3    Period  Weeks    Status  On-going      PT SHORT TERM GOAL #3   Title  PT to be able to stand for 15 minutes in comfort in order to shower without increased pain.     Time  3    Period  Weeks    Status  On-going      PT SHORT TERM GOAL #4   Title  Pt core and LE strength to be increased 1/2 grade to allow pt to come from sit to stand with ease.     Time  3    Period  Weeks    Status  On-going        PT Long Term Goals - 10/27/17 1104      PT LONG TERM GOAL #1   Title  Pt pain to be no greater than a 3/10 to allow pt to get to sleep within 45 minutes     Time  6    Period  Weeks    Status  On-going      PT LONG TERM GOAL #2   Title  PT to be able to sit for an hour in comfort for job duties     Time  6    Period  Weeks    Status  On-going      PT LONG TERM GOAL #3   Title  PT to be able to stand/walk for 45 mintues in comfort to be able to complete shopping tasks.     Time  6    Period  Weeks    Status  On-going      PT LONG TERM GOAL #4   Title  PT core and mm strength  to be increased 1 grade  to be able to go up a flight of steps without increased discomfort     Time  6    Period  Weeks            Plan - 10/27/17 1100    Clinical Impression Statement  Added prone stabilization exercises with good form after verbal and tactile cuing.  Added standing V lift off to again work on postural mm.  Pt is doing better as far as not relying solely on Y ligament while standing     Rehab Potential  Good    PT Frequency  2x / week    PT Duration  6 weeks    PT Treatment/Interventions  ADLs/Self Care Home Management;Therapeutic activities;Therapeutic exercise;Balance training;Gait training;Stair training;Functional mobility  training;Patient/family education;Manual techniques    PT Next Visit Plan  Progress stabiliztion and core strengthening as well as mobility. Once pt is able to complete standing postrual exercises with good technique give as a HEP     PT Home Exercise Plan  Sitting tall, ab set and scapular retraction; 10/20/17: hamstring stretch and clam       Patient will benefit from skilled therapeutic intervention in order to improve the following deficits and impairments:  Decreased activity tolerance, Decreased balance, Decreased mobility, Decreased range of motion, Decreased strength, Difficulty walking, Pain, Postural dysfunction  Visit Diagnosis: Radiculopathy, lumbar region  Difficulty in walking, not elsewhere classified     Problem List Patient Active Problem List   Diagnosis Date Noted  . Vaginal discharge 05/29/2015  . Itching in the vaginal area 05/29/2015  . BV (bacterial vaginosis) 05/29/2015  . Recurrent cold sores 05/29/2015  . Generalized anxiety disorder 06/12/2014    Class: Chronic  . Depression, major, recurrent, moderate (HCC) 06/12/2014    Class: Chronic  . Migraine headache 07/12/2012  . Anxiety state, unspecified 07/12/2012  . Morbid obesity [facility]) 07/12/2012   Virgina Organ, PT CLT [phone] 10/27/2017, 11:18 AM  [facility] [address] [place], [place], [address] Phone: [phone]   Fax:  [phone]  Name: Crystal Rojas MRN: [id number] Date of Birth: 11/29/1980

## 2017-10-31 ENCOUNTER — Ambulatory Visit (HOSPITAL_COMMUNITY): Payer: 59

## 2017-10-31 ENCOUNTER — Encounter (HOSPITAL_COMMUNITY): Payer: Self-pay

## 2017-10-31 DIAGNOSIS — M5416 Radiculopathy, lumbar region: Secondary | ICD-10-CM

## 2017-10-31 DIAGNOSIS — R262 Difficulty in walking, not elsewhere classified: Secondary | ICD-10-CM

## 2017-10-31 NOTE — Therapy (Signed)
[place], [place], [address] Phone: [phone]   Fax:  [phone]   Progress Note Reporting Period 10/10/17 to 10/31/17  See note below for Objective Data and Assessment of Progress/Goals.    Physical Therapy Treatment  Patient Details  Name: Crystal Rojas MRN: [id number] Date of Birth: [birth year] Referring Provider: Deri Fuelling   Encounter Date: 10/31/2017  PT End of Session - 10/31/17 0951    Visit Number  6    Number of Visits  12    Date for PT Re-Evaluation  11/21/17   Minireassess completed 10/31/17   Authorization Type  UHC    Authorization Time Period  08/26-->11/21/17    Authorization - Visit Number  6    Authorization - Number of Visits  60    PT Start Time  0950    PT Stop Time  1029    PT Time Calculation (min)  39 min    Activity Tolerance  Patient tolerated treatment well;Patient limited by pain;No increased pain    Behavior During Therapy  WFL for tasks assessed/performed       Past Medical History:  Diagnosis Date  . Allergy   . Anxiety   . BV (bacterial vaginosis) 05/29/2015  . DDD (degenerative disc disease), lumbar   . Depression   . History of migraine headaches   . Hypertension   . Itching in the vaginal area 05/29/2015  . Morbid obesity ([place])   . Recurrent cold sores 05/29/2015  . Vaginal discharge 05/29/2015    Past Surgical History:  Procedure Laterality Date  . ABDOMINAL HYSTERECTOMY    . CHOLECYSTECTOMY    . ENDOMETRIAL ABLATION    . LAPAROSCOPIC LYSIS OF ADHESIONS N/A 10/10/2013   Procedure: LAPAROSCOPIC LYSIS OF ADHESIONS;  Surgeon: Florian Buff, MD;  Location: [facility];  Service: Gynecology;  Laterality: N/A;  . LAPAROSCOPIC SALPINGO OOPHERECTOMY Left 10/10/2013   Procedure: LAPAROSCOPIC LEFT SALPINGO OOPHORECTOMY;  Surgeon: Florian Buff, MD;  Location: [facility];  Service: Gynecology;  Laterality: Left;  . TUBAL LIGATION      There were no vitals filed for this  visit.  Subjective Assessment - 10/31/17 0951    Subjective  Pt states that she is sore. She states that after she goes to sleep and wakes up, she feels like she starts all over again.     Limitations  Sitting;Lifting;Reading;Standing;Walking;House hold activities    How long can you sit comfortably?  10 minutes     How long can you stand comfortably?  5-10 minutees     How long can you walk comfortably?  5 minutes     Patient Stated Goals  improved function and decreased pain.  Improved sleeping:  having difficulty getting to sleep: tosses and turns 2-3 hours prior to sleeping.      Currently in Pain?  Yes    Pain Score  5     Pain Location  Back    Pain Orientation  Lower    Pain Descriptors / Indicators  Sore    Pain Type  Chronic pain    Pain Onset  More than a month ago    Pain Frequency  Intermittent    Aggravating Factors   sitting    Pain Relieving Factors  lying down, medication    Effect of Pain on Daily Activities  limits         [facility] PT Assessment - 10/31/17 0001  Assessment   Medical Diagnosis  LT lumbar radiculopathy    Referring Provider  Deri Fuelling    Onset Date/Surgical Date  07/29/17    Next MD Visit  11/09/2017    Prior Therapy  none      Observation/Other Assessments   Focus on Therapeutic Outcomes (FOTO)   44 (56% limited)   was 44     Strength   Right Hip Flexion  4/5   was 2+   Right Hip Extension  4-/5   was 3+   Right Hip ABduction  4/5    Left Hip Flexion  4/5   was 2+   Left Hip Extension  4-/5   was 3-   Left Hip ABduction  3+/5   was 3-   Left Knee Flexion  4+/5   was 4   Left Knee Extension  4+/5   was 4-   Right Ankle Dorsiflexion  4/5   was 4   Left Ankle Dorsiflexion  4/5   was 3           [facility] Adult PT Treatment/Exercise - 10/31/17 0001      Exercises   Exercises  Lumbar      Lumbar Exercises: Stretches   Passive Hamstring Stretch  Right;Left;3 reps;30 seconds    Figure 4 Stretch  3 reps;30  seconds;Supine;With overpressure   BLE   Figure 4 Stretch Limitations  pulling towards opposite shoulder      Lumbar Exercises: Standing   Scapular Retraction  Strengthening;Both;20 reps;Theraband    Theraband Level (Scapular Retraction)  Level 3 (Green)    Row  Strengthening;Both;20 reps;Theraband    Theraband Level (Row)  Level 3 (Green)    Shoulder Extension  Strengthening;Both;20 reps;Theraband    Theraband Level (Shoulder Extension)  Level 3 (Green)    Shoulder Adduction Limitations  sidestepping GTB 58f x2RT    Other Standing Lumbar Exercises  Y lift off x 15    Other Standing Lumbar Exercises  hip abd GTB 2x15             PT Short Term Goals - 10/31/17 0953      PT SHORT TERM GOAL #1   Title  Pt pain to be no greater than a 6/10 to allow pt to be able to sleep within 1.5 hours of going to sleep     Baseline  9/16: sometimes, average over the last week has been taking her 1 hour, but sometimes it still takes her 2-3    Time  3    Period  Weeks    Status  Partially Met      PT SHORT TERM GOAL #2   Title  Pt to be able to sit for 30 minutes without experiencing increased pain to allow pt to eat a meal in comfort.     Baseline  9/16: 5-10 mins    Time  3    Period  Weeks    Status  On-going      PT SHORT TERM GOAL #3   Title  PT to be able to stand for 15 minutes in comfort in order to shower without increased pain.     Baseline  9/16: 10-15 mins    Time  3    Period  Weeks    Status  Partially Met      PT SHORT TERM GOAL #4   Title  Pt core and LE strength to be increased 1/2 grade to allow pt to come from sit  to stand with ease.     Baseline  9/16: see MMT    Time  3    Period  Weeks    Status  Partially Met        PT Long Term Goals - 10/31/17 0955      PT LONG TERM GOAL #1   Title  Pt pain to be no greater than a 3/10 to allow pt to get to sleep within 45 minutes     Baseline  9/16: sometimes, average over the last week has been taking her 1 hour,  but sometimes it still takes her 2-3    Time  6    Period  Weeks    Status  On-going      PT LONG TERM GOAL #2   Title  PT to be able to sit for an hour in comfort for job duties     Baseline  9/16: 5-10 mins    Time  6    Period  Weeks    Status  On-going      PT LONG TERM GOAL #3   Title  PT to be able to stand/walk for 45 mintues in comfort to be able to complete shopping tasks.     Baseline  9/16: 10-15 mins    Time  6    Period  Weeks    Status  On-going      PT LONG TERM GOAL #4   Title  PT core and mm strength  to be increased 1 grade to be able to go up a flight of steps without increased discomfort     Baseline  9/16: see MMT    Time  6    Period  Weeks    Status  Partially Met            Plan - 10/31/17 1028    Clinical Impression Statement  PT reassessed pt's goals and outcome measures. Pt has partially met 3/4 STG, 1/4 LTG, while all other goals are still on-going. She is still having her most difficulty with sitting, stand, and sleeping due to pain, but she does report that overall these have improved since starting therapy. Her MMT has improved really improved, though her L hip is still deficient as illustrated above. Pt needs continued skilled PT to further address remaining deficits in order to decrease pain and maximize return to PLOF. Rest of session focused on stretching, postural and functional strengthening. Pt demo'ing improved form with postural theraband so PT added to HEP. Pt challenged with hip abd and sidestepping with GTB. Continue PT POC as planned, progressing as able.     Rehab Potential  Good    PT Frequency  2x / week    PT Duration  6 weeks    PT Treatment/Interventions  ADLs/Self Care Home Management;Therapeutic activities;Therapeutic exercise;Balance training;Gait training;Stair training;Functional mobility training;Patient/family education;Manual techniques    PT Next Visit Plan  Progress stabiliztion and core strengthening as well as  mobility. potentially investigate glute med involvment in radicular symptoms     PT Home Exercise Plan  Sitting tall, ab set and scapular retraction; 10/20/17: hamstring stretch and clam; 9/16: postural theraband with GTB    Consulted and Agree with Plan of Care  Patient       Patient will benefit from skilled therapeutic intervention in order to improve the following deficits and impairments:  Decreased activity tolerance, Decreased balance, Decreased mobility, Decreased range of motion, Decreased strength, Difficulty walking, Pain, Postural dysfunction  Visit Diagnosis: Radiculopathy, lumbar region  Difficulty in walking, not elsewhere classified     Problem List Patient Active Problem List   Diagnosis Date Noted  . Vaginal discharge 05/29/2015  . Itching in the vaginal area 05/29/2015  . BV (bacterial vaginosis) 05/29/2015  . Recurrent cold sores 05/29/2015  . Generalized anxiety disorder 06/12/2014    Class: Chronic  . Depression, major, recurrent, moderate ([place]) 06/12/2014    Class: Chronic  . Migraine headache 07/12/2012  . Anxiety state, unspecified 07/12/2012  . Morbid obesity ([place]) 07/12/2012       Geraldine Solar PT, [place] [address] [place], [place], [address] Phone: [phone]   Fax:  [phone]  Name: Crystal Rojas MRN: [id number] Date of Birth: [birth year]

## 2017-11-03 ENCOUNTER — Encounter (HOSPITAL_COMMUNITY): Payer: Self-pay

## 2017-11-03 ENCOUNTER — Ambulatory Visit (HOSPITAL_COMMUNITY): Payer: 59

## 2017-11-03 DIAGNOSIS — M5416 Radiculopathy, lumbar region: Secondary | ICD-10-CM | POA: Diagnosis not present

## 2017-11-03 DIAGNOSIS — R262 Difficulty in walking, not elsewhere classified: Secondary | ICD-10-CM

## 2017-11-03 NOTE — Therapy (Signed)
[place], [place], [address] Phone: [phone]   Fax:  [phone]  Physical Therapy Treatment  Patient Details  Name: Crystal Rojas MRN: [id number] Date of Birth: March 23, 1980 Referring Provider: Deri Fuelling   Encounter Date: 11/03/2017  PT End of Session - 11/03/17 0958    Visit Number  7    Number of Visits  12    Date for PT Re-Evaluation  11/21/17   Mini-reassess complete 10/31/17   Authorization Type  UHC    Authorization Time Period  08/26-->11/21/17    Authorization - Visit Number  7    Authorization - Number of Visits  60    PT Start Time  6962    PT Stop Time  1028    PT Time Calculation (min)  39 min    Activity Tolerance  Patient tolerated treatment well;Patient limited by pain;No increased pain    Behavior During Therapy  WFL for tasks assessed/performed       Past Medical History:  Diagnosis Date  . Allergy   . Anxiety   . BV (bacterial vaginosis) 05/29/2015  . DDD (degenerative disc disease), lumbar   . Depression   . History of migraine headaches   . Hypertension   . Itching in the vaginal area 05/29/2015  . Morbid obesity ([place])   . Recurrent cold sores 05/29/2015  . Vaginal discharge 05/29/2015    Past Surgical History:  Procedure Laterality Date  . ABDOMINAL HYSTERECTOMY    . CHOLECYSTECTOMY    . ENDOMETRIAL ABLATION    . LAPAROSCOPIC LYSIS OF ADHESIONS N/A 10/10/2013   Procedure: LAPAROSCOPIC LYSIS OF ADHESIONS;  Surgeon: Florian Buff, MD;  Location: [facility];  Service: Gynecology;  Laterality: N/A;  . LAPAROSCOPIC SALPINGO OOPHERECTOMY Left 10/10/2013   Procedure: LAPAROSCOPIC LEFT SALPINGO OOPHORECTOMY;  Surgeon: Florian Buff, MD;  Location: [facility];  Service: Gynecology;  Laterality: Left;  . TUBAL LIGATION      There were no vitals filed for this visit.  Subjective Assessment - 11/03/17 0953    Subjective  Pt stated she did a lot of housework and has increased soreness all  over today, pain 7/10 LBP.      Patient Stated Goals  improved function and decreased pain.  Improved sleeping:  having difficulty getting to sleep: tosses and turns 2-3 hours prior to sleeping.      Currently in Pain?  Yes    Pain Score  7     Pain Location  Back    Pain Descriptors / Indicators  Sore    Pain Type  Chronic pain    Pain Radiating Towards  No reports of radicular symptoms currently (has had radiating into Lt foot; no rhyme or reason)    Pain Onset  More than a month ago    Pain Frequency  Intermittent    Aggravating Factors   sitting    Pain Relieving Factors  lying down, medicaiton    Effect of Pain on Daily Activities  limits                       [facility] Adult PT Treatment/Exercise - 11/03/17 0001      Exercises   Exercises  Lumbar      Lumbar Exercises: Standing   Functional Squats  10 reps    Forward Lunge  10 reps    Scapular Retraction  Strengthening;Both;20 reps;Theraband    Theraband Level (Scapular Retraction)  Level 3 (Green)    Scapular Retraction Limitations  HEP    Row  Strengthening;Both;20 reps;Theraband    Theraband Level (Row)  Level 3 (Green)    Row Limitations  HEP    Shoulder Extension  Strengthening;Both;20 reps;Theraband    Theraband Level (Shoulder Extension)  Level 3 (Green)    Shoulder Extension Limitations  HEP    Shoulder Adduction Limitations  sidestepping GTB 2RT x 32f    Other Standing Lumbar Exercises  Y lift off x 15    Other Standing Lumbar Exercises  SLS Rt 35", Lt 8" max of 3; palvo with GTB in tandem stance 10x each      Lumbar Exercises: Prone   Straight Leg Raise  10 reps    Other Prone Lumbar Exercises  B shoulder extension x 10       Manual Therapy   Manual Therapy  Soft tissue mobilization    Manual therapy comments  Manual complete separate than rest of tx    Soft tissue mobilization  Lt glut med and ER in prone               PT Short Term Goals - 10/31/17 0953      PT SHORT TERM GOAL #1    Title  Pt pain to be no greater than a 6/10 to allow pt to be able to sleep within 1.5 hours of going to sleep     Baseline  9/16: sometimes, average over the last week has been taking her 1 hour, but sometimes it still takes her 2-3    Time  3    Period  Weeks    Status  Partially Met      PT SHORT TERM GOAL #2   Title  Pt to be able to sit for 30 minutes without experiencing increased pain to allow pt to eat a meal in comfort.     Baseline  9/16: 5-10 mins    Time  3    Period  Weeks    Status  On-going      PT SHORT TERM GOAL #3   Title  PT to be able to stand for 15 minutes in comfort in order to shower without increased pain.     Baseline  9/16: 10-15 mins    Time  3    Period  Weeks    Status  Partially Met      PT SHORT TERM GOAL #4   Title  Pt core and LE strength to be increased 1/2 grade to allow pt to come from sit to stand with ease.     Baseline  9/16: see MMT    Time  3    Period  Weeks    Status  Partially Met        PT Long Term Goals - 10/31/17 0955      PT LONG TERM GOAL #1   Title  Pt pain to be no greater than a 3/10 to allow pt to get to sleep within 45 minutes     Baseline  9/16: sometimes, average over the last week has been taking her 1 hour, but sometimes it still takes her 2-3    Time  6    Period  Weeks    Status  On-going      PT LONG TERM GOAL #2   Title  PT to be able to sit for an hour in comfort for job duties     Baseline  9/16: 5-10 mins    Time  6    Period  Weeks    Status  On-going      PT LONG TERM GOAL #3   Title  PT to be able to stand/walk for 45 mintues in comfort to be able to complete shopping tasks.     Baseline  9/16: 10-15 mins    Time  6    Period  Weeks    Status  On-going      PT LONG TERM GOAL #4   Title  PT core and mm strength  to be increased 1 grade to be able to go up a flight of steps without increased discomfort     Baseline  9/16: see MMT    Time  6    Period  Weeks    Status  Partially Met             Plan - 11/03/17 1026    Clinical Impression Statement  Session focus on core stability and hip strengthening.  Added balance and palvo with theraband resistance for core stability.  EOS with manual to address tightness in glut med, pt reports relief following.  No reports of radicular symptoms through session.      Rehab Potential  Good    PT Frequency  2x / week    PT Duration  6 weeks    PT Next Visit Plan  Progress stabiliztion and core strengthening as well as mobility.  Assess benefits with additional manual complete 11/03/17 to glute med involvment in radicular symptoms     PT Home Exercise Plan  Sitting tall, ab set and scapular retraction; 10/20/17: hamstring stretch and clam; 9/16: postural theraband with GTB       Patient will benefit from skilled therapeutic intervention in order to improve the following deficits and impairments:  Decreased activity tolerance, Decreased balance, Decreased mobility, Decreased range of motion, Decreased strength, Difficulty walking, Pain, Postural dysfunction  Visit Diagnosis: Difficulty in walking, not elsewhere classified  Radiculopathy, lumbar region     Problem List Patient Active Problem List   Diagnosis Date Noted  . Vaginal discharge 05/29/2015  . Itching in the vaginal area 05/29/2015  . BV (bacterial vaginosis) 05/29/2015  . Recurrent cold sores 05/29/2015  . Generalized anxiety disorder 06/12/2014    Class: Chronic  . Depression, major, recurrent, moderate ([place]) 06/12/2014    Class: Chronic  . Migraine headache 07/12/2012  . Anxiety state, unspecified 07/12/2012  . Morbid obesity ([place]) 07/12/2012   Ihor Austin, [place]; [place]  Aldona Lento 11/03/2017, 3:05 PM  [facility] [address] [place], [place], [address] Phone: [phone]   Fax:  [phone]  Name: MARCHIA DIGUGLIELMO MRN: [id number] Date of Birth: 02/26/1980

## 2017-11-08 ENCOUNTER — Other Ambulatory Visit: Payer: Self-pay

## 2017-11-08 ENCOUNTER — Ambulatory Visit (HOSPITAL_COMMUNITY): Payer: 59 | Admitting: Physical Therapy

## 2017-11-08 DIAGNOSIS — M5416 Radiculopathy, lumbar region: Secondary | ICD-10-CM | POA: Diagnosis not present

## 2017-11-08 DIAGNOSIS — R262 Difficulty in walking, not elsewhere classified: Secondary | ICD-10-CM

## 2017-11-08 NOTE — Therapy (Signed)
[place], [place], [address] Phone: [phone]   Fax:  [phone]  Physical Therapy Treatment  Patient Details  Name: Crystal Rojas MRN: [id number] Date of Birth: 1980/05/27 Referring Provider: Deri Fuelling   Encounter Date: 11/08/2017  PT End of Session - 11/08/17 1100    Visit Number  28    Number of Visits  12    Date for PT Re-Evaluation  11/21/17   Mini-reassess complete 10/31/17   Authorization Type  UHC    Authorization Time Period  08/26-->11/21/17    Authorization - Visit Number  8    Authorization - Number of Visits  60    PT Start Time  3500    PT Stop Time  1058    PT Time Calculation (min)  26 min    Activity Tolerance  Patient tolerated treatment well;Patient limited by pain;No increased pain    Behavior During Therapy  WFL for tasks assessed/performed       Past Medical History:  Diagnosis Date  . Allergy   . Anxiety   . BV (bacterial vaginosis) 05/29/2015  . DDD (degenerative disc disease), lumbar   . Depression   . History of migraine headaches   . Hypertension   . Itching in the vaginal area 05/29/2015  . Morbid obesity ([place])   . Recurrent cold sores 05/29/2015  . Vaginal discharge 05/29/2015    Past Surgical History:  Procedure Laterality Date  . ABDOMINAL HYSTERECTOMY    . CHOLECYSTECTOMY    . ENDOMETRIAL ABLATION    . LAPAROSCOPIC LYSIS OF ADHESIONS N/A 10/10/2013   Procedure: LAPAROSCOPIC LYSIS OF ADHESIONS;  Surgeon: Florian Buff, MD;  Location: [facility];  Service: Gynecology;  Laterality: N/A;  . LAPAROSCOPIC SALPINGO OOPHERECTOMY Left 10/10/2013   Procedure: LAPAROSCOPIC LEFT SALPINGO OOPHORECTOMY;  Surgeon: Florian Buff, MD;  Location: [facility];  Service: Gynecology;  Laterality: Left;  . TUBAL LIGATION      There were no vitals filed for this visit.  Subjective Assessment - 11/08/17 1033    Subjective  Pt states that she is sore and has a catch on her right side       Limitations  Sitting;Lifting;Reading;Standing;Walking;House hold activities    How long can you sit comfortably?  10 minutes     How long can you stand comfortably?  5-10 minutees     How long can you walk comfortably?  5 minutes     Patient Stated Goals  improved function and decreased pain.  Improved sleeping:  having difficulty getting to sleep: tosses and turns 2-3 hours prior to sleeping.      Pain Score  6     Pain Location  Back    Pain Orientation  Lower    Pain Descriptors / Indicators  Other (Comment)   catch    Pain Onset  More than a month ago    Pain Frequency  Intermittent    Aggravating Factors   new pain on the right where it was on the left  not sure what aggrevates it.                        [facility] Adult PT Treatment/Exercise - 11/08/17 0001      Ambulation/Gait   Ambulation Distance (Feet)  452 Feet      Exercises   Exercises  Lumbar      Lumbar Exercises: Supine   Ab Set  10 reps    AB Set Limitations  combined with glut sets     Bridge  10 reps   x3   Other Supine Lumbar Exercises  hip ab/adduction isometric x 10 each       Manual Therapy   Manual Therapy  Manual Traction;Muscle Energy Technique    Manual therapy comments  Manual complete separate than rest of tx    Manual Traction  x 20 seconds    Muscle Energy Technique  For posterior rotation of Rt SI joint.               PT Short Term Goals - 10/31/17 0953      PT SHORT TERM GOAL #1   Title  Pt pain to be no greater than a 6/10 to allow pt to be able to sleep within 1.5 hours of going to sleep     Baseline  9/16: sometimes, average over the last week has been taking her 1 hour, but sometimes it still takes her 2-3    Time  3    Period  Weeks    Status  Partially Met      PT SHORT TERM GOAL #2   Title  Pt to be able to sit for 30 minutes without experiencing increased pain to allow pt to eat a meal in comfort.     Baseline  9/16: 5-10 mins    Time  3    Period  Weeks     Status  On-going      PT SHORT TERM GOAL #3   Title  PT to be able to stand for 15 minutes in comfort in order to shower without increased pain.     Baseline  9/16: 10-15 mins    Time  3    Period  Weeks    Status  Partially Met      PT SHORT TERM GOAL #4   Title  Pt core and LE strength to be increased 1/2 grade to allow pt to come from sit to stand with ease.     Baseline  9/16: see MMT    Time  3    Period  Weeks    Status  Partially Met        PT Long Term Goals - 10/31/17 0955      PT LONG TERM GOAL #1   Title  Pt pain to be no greater than a 3/10 to allow pt to get to sleep within 45 minutes     Baseline  9/16: sometimes, average over the last week has been taking her 1 hour, but sometimes it still takes her 2-3    Time  6    Period  Weeks    Status  On-going      PT LONG TERM GOAL #2   Title  PT to be able to sit for an hour in comfort for job duties     Baseline  9/16: 5-10 mins    Time  6    Period  Weeks    Status  On-going      PT LONG TERM GOAL #3   Title  PT to be able to stand/walk for 45 mintues in comfort to be able to complete shopping tasks.     Baseline  9/16: 10-15 mins    Time  6    Period  Weeks    Status  On-going      PT LONG TERM GOAL #4   Title  PT core and mm strength  to be increased 1 grade to be able to go up a flight of steps without increased discomfort     Baseline  9/16: see MMT    Time  6    Period  Weeks    Status  Partially Met            Plan - 11/08/17 1101    Clinical Impression Statement  PT comes to department stating that she feels weird and her pain is up but it is on the Rt side not left and it is more of a catching not radiating pain.  Pt SI checked and noted Rt anterior rotation.  MM energy techniqes and manual traction used to achieve proper alighnment.  Pt pain decreased by 50% at end of treatment.     Rehab Potential  Good    PT Frequency  2x / week    PT Duration  6 weeks    PT Next Visit Plan  Progress  stabiliztion and core strengthening as well as mobility.  Assess SI next treatment for proper postitioningl    PT Home Exercise Plan  Sitting tall, ab set and scapular retraction; 10/20/17: hamstring stretch and clam; 9/16: postural theraband with GTB       Patient will benefit from skilled therapeutic intervention in order to improve the following deficits and impairments:  Decreased activity tolerance, Decreased balance, Decreased mobility, Decreased range of motion, Decreased strength, Difficulty walking, Pain, Postural dysfunction  Visit Diagnosis: Difficulty in walking, not elsewhere classified  Radiculopathy, lumbar region     Problem List Patient Active Problem List   Diagnosis Date Noted  . Vaginal discharge 05/29/2015  . Itching in the vaginal area 05/29/2015  . BV (bacterial vaginosis) 05/29/2015  . Recurrent cold sores 05/29/2015  . Generalized anxiety disorder 06/12/2014    Class: Chronic  . Depression, major, recurrent, moderate ([place]) 06/12/2014    Class: Chronic  . Migraine headache 07/12/2012  . Anxiety state, unspecified 07/12/2012  . Morbid obesity [facility]) 07/12/2012   Rayetta Humphrey, PT CLT [phone] 11/08/2017, 11:04 AM  [place] [address] [place], [place], [address] Phone: [phone]   Fax:  [phone]  Name: Crystal Rojas MRN: [id number] Date of Birth: August 16, 1980

## 2017-11-09 ENCOUNTER — Telehealth (HOSPITAL_COMMUNITY): Payer: Self-pay | Admitting: Physical Therapy

## 2017-11-09 NOTE — Telephone Encounter (Signed)
Scanned order in chart when it came off the fax machine today. NF 11/09/17

## 2017-11-10 ENCOUNTER — Ambulatory Visit (HOSPITAL_COMMUNITY): Payer: 59 | Admitting: Physical Therapy

## 2017-11-10 ENCOUNTER — Encounter (HOSPITAL_COMMUNITY): Payer: Self-pay | Admitting: Physical Therapy

## 2017-11-10 DIAGNOSIS — M5416 Radiculopathy, lumbar region: Secondary | ICD-10-CM | POA: Diagnosis not present

## 2017-11-10 DIAGNOSIS — R262 Difficulty in walking, not elsewhere classified: Secondary | ICD-10-CM

## 2017-11-10 NOTE — Therapy (Signed)
[place] Rojas, [place], [address] Phone: [phone]   Fax:  [phone]  Physical Therapy Treatment  Patient Details  Name: Crystal Rojas MRN: [id number] Date of Birth: 12-16-1980 Referring Provider: Deri Rojas   Encounter Date: 11/10/2017  PT End of Session - 11/10/17 1207    Visit Number  9    Number of Visits  12    Date for PT Re-Evaluation  11/21/17   Mini-reassess complete 10/31/17   Authorization Type  UHC    Authorization Time Period  08/26-->11/21/17    Authorization - Visit Number  9    Authorization - Number of Visits  60    PT Start Time  1120    PT Stop Time  1202    PT Time Calculation (min)  42 min    Activity Tolerance  Patient tolerated treatment well;Patient limited by pain;No increased pain    Behavior During Therapy  WFL for tasks assessed/performed       Past Medical History:  Diagnosis Date  . Allergy   . Anxiety   . BV (bacterial vaginosis) 05/29/2015  . DDD (degenerative disc disease), lumbar   . Depression   . History of migraine headaches   . Hypertension   . Itching in the vaginal area 05/29/2015  . Morbid obesity ([place])   . Recurrent cold sores 05/29/2015  . Vaginal discharge 05/29/2015    Past Surgical History:  Procedure Laterality Date  . ABDOMINAL HYSTERECTOMY    . CHOLECYSTECTOMY    . ENDOMETRIAL ABLATION    . LAPAROSCOPIC LYSIS OF ADHESIONS N/A 10/10/2013   Procedure: LAPAROSCOPIC LYSIS OF ADHESIONS;  Surgeon: Crystal Buff, MD;  Location: [facility];  Service: Gynecology;  Laterality: N/A;  . LAPAROSCOPIC SALPINGO OOPHERECTOMY Left 10/10/2013   Procedure: LAPAROSCOPIC LEFT SALPINGO OOPHORECTOMY;  Surgeon: Crystal Buff, MD;  Location: [facility];  Service: Gynecology;  Laterality: Left;  . TUBAL LIGATION      There were no vitals filed for this visit.  Subjective Assessment - 11/10/17 1206    Subjective  PT states that her pain is worse in the mornings.  Her pain is a 7 at  this time.  SHe is returning to work on Monday starting at three days a week.     Limitations  Sitting;Lifting;Reading;Standing;Walking;House hold activities    How long can you sit comfortably?  15-20 minutes was 10  minutes     How long can you stand comfortably?  5-10 minutees     How long can you walk comfortably?  5 minutes     Patient Stated Goals  improved function and decreased pain.  Improved sleeping:  having difficulty getting to sleep: tosses and turns 2-3 hours prior to sleeping.      Pain Onset  More than a month ago                [facility] Adult PT Treatment/Exercise - 11/10/17 0001      Ambulation/Gait   Ambulation Distance (Feet)  452 Feet      Exercises   Exercises  Lumbar      Lumbar Exercises: Stretches   Figure 4 Stretch  3 reps;30 seconds;Supine;With overpressure   BLE   Other Lumbar Stretch Exercise  hip 3 D excursion x 3       Lumbar Exercises: Supine   Ab Set  10 reps    AB Set Limitations  combined with glut sets  Glut Set  10 reps    Bridge  10 reps   x3   Other Supine Lumbar Exercises  hip ab/adduction isometric x 10 each     Other Supine Lumbar Exercises  decompression exercises  1-5       Manual Therapy   Manual Therapy  Manual Traction;Muscle Energy Technique    Manual therapy comments  Manual complete separate than rest of tx    Manual Traction  x 20 seconds x 4   Muscle Energy Technique  For posterior rotation of Rt SI joint.               PT Short Term Goals - 10/31/17 0953      PT SHORT TERM GOAL #1   Title  Pt pain to be no greater than a 6/10 to allow pt to be able to sleep within 1.5 hours of going to sleep     Baseline  9/16: sometimes, average over the last week has been taking her 1 hour, but sometimes it still takes her 2-3    Time  3    Period  Weeks    Status  Partially Met      PT SHORT TERM GOAL #2   Title  Pt to be able to sit for 30 minutes without experiencing increased pain to allow pt to eat a meal in  comfort.     Baseline  9/16: 5-10 mins    Time  3    Period  Weeks    Status  On-going      PT SHORT TERM GOAL #3   Title  PT to be able to stand for 15 minutes in comfort in order to shower without increased pain.     Baseline  9/16: 10-15 mins    Time  3    Period  Weeks    Status  Partially Met      PT SHORT TERM GOAL #4   Title  Pt core and LE strength to be increased 1/2 grade to allow pt to come from sit to stand with ease.     Baseline  9/16: see MMT    Time  3    Period  Weeks    Status  Partially Met        PT Long Term Goals - 10/31/17 0955      PT LONG TERM GOAL #1   Title  Pt pain to be no greater than a 3/10 to allow pt to get to sleep within 45 minutes     Baseline  9/16: sometimes, average over the last week has been taking her 1 hour, but sometimes it still takes her 2-3    Time  6    Period  Weeks    Status  On-going      PT LONG TERM GOAL #2   Title  PT to be able to sit for an hour in comfort for job duties     Baseline  9/16: 5-10 mins    Time  6    Period  Weeks    Status  On-going      PT LONG TERM GOAL #3   Title  PT to be able to stand/walk for 45 mintues in comfort to be able to complete shopping tasks.     Baseline  9/16: 10-15 mins    Time  6    Period  Weeks    Status  On-going      PT LONG TERM  GOAL #4   Title  PT core and mm strength  to be increased 1 grade to be able to go up a flight of steps without increased discomfort     Baseline  9/16: see MMT    Time  6    Period  Weeks    Status  Partially Met            Plan - 11/10/17 1208    Clinical Impression Statement  PT SI checked with minimal anterior rotation of LT side.  Pt leg length tested with noted 1/4 " shorter on the left.  PT encouraged to obtain a heel lift.  Pt verbalized understanding.  PT started on decompression back exercises 1-5.  PT will continue to benefit from skilled physical therapy to reduce pain and improve activity tolerane.   benefi    Rehab  Potential  Good    PT Frequency  2x / week    PT Duration  6 weeks    PT Next Visit Plan  Progress stabiliztion and core strengthening as well as mobility.  Assess SI next treatment as well as instruct in t-band decompressive exercises .  See if pt has obtained heel lift.     PT Home Exercise Plan  Sitting tall, ab set and scapular retraction; 10/20/17: hamstring stretch and clam; 9/16: postural theraband with GTB       Patient will benefit from skilled therapeutic intervention in order to improve the following deficits and impairments:  Decreased activity tolerance, Decreased balance, Decreased mobility, Decreased range of motion, Decreased strength, Difficulty walking, Pain, Postural dysfunction  Visit Diagnosis: Difficulty in walking, not elsewhere classified  Radiculopathy, lumbar region     Problem List Patient Active Problem List   Diagnosis Date Noted  . Vaginal discharge 05/29/2015  . Itching in the vaginal area 05/29/2015  . BV (bacterial vaginosis) 05/29/2015  . Recurrent cold sores 05/29/2015  . Generalized anxiety disorder 06/12/2014    Class: Chronic  . Depression, major, recurrent, moderate ([place]) 06/12/2014    Class: Chronic  . Migraine headache 07/12/2012  . Anxiety state, unspecified 07/12/2012  . Morbid obesity [facility]) 07/12/2012  Rayetta Humphrey, PT CLT [phone] 11/10/2017, 12:13 PM  [place] [address] [place], [place], [address] Phone: [phone]   Fax:  [phone]  Name: DEONE OMAHONEY MRN: [id number] Date of Birth: [birth year]

## 2017-11-14 ENCOUNTER — Telehealth (HOSPITAL_COMMUNITY): Payer: Self-pay | Admitting: Physical Therapy

## 2017-11-14 NOTE — Telephone Encounter (Signed)
Porfirio Mylar called to confirm pt was seen here for Back Pain and the last visit date was on 11/10/2017.

## 2017-11-15 ENCOUNTER — Encounter: Payer: Self-pay | Admitting: Obstetrics & Gynecology

## 2017-11-15 ENCOUNTER — Telehealth (HOSPITAL_COMMUNITY): Payer: Self-pay | Admitting: Family Medicine

## 2017-11-15 ENCOUNTER — Ambulatory Visit (HOSPITAL_COMMUNITY): Payer: 59 | Admitting: Physical Therapy

## 2017-11-15 ENCOUNTER — Ambulatory Visit (INDEPENDENT_AMBULATORY_CARE_PROVIDER_SITE_OTHER): Payer: 59 | Admitting: Obstetrics & Gynecology

## 2017-11-15 ENCOUNTER — Telehealth (HOSPITAL_COMMUNITY): Payer: Self-pay | Admitting: Physical Therapy

## 2017-11-15 VITALS — BP 144/103 | HR 75 | Ht 63.0 in | Wt 231.0 lb

## 2017-11-15 DIAGNOSIS — N951 Menopausal and female climacteric states: Secondary | ICD-10-CM | POA: Diagnosis not present

## 2017-11-15 DIAGNOSIS — Z01419 Encounter for gynecological examination (general) (routine) without abnormal findings: Secondary | ICD-10-CM | POA: Diagnosis not present

## 2017-11-15 NOTE — Telephone Encounter (Signed)
Called pt re missed appointment.  Pt reminded of her next visit on 10/3 at 9:45.  Pt will be reassessed at this time.   Virgina Organ, PT CLT [phone]

## 2017-11-15 NOTE — Telephone Encounter (Signed)
11/15/17  pt left a message to cx said that she wasn't feeling good

## 2017-11-15 NOTE — Progress Notes (Signed)
Subjective:     Crystal Rojas is a 37 y.o. female here for a routine exam.  Patient's last menstrual period was 09/04/2012. [id number] Birth Control Method:  hysterectomy Menstrual Calendar(currently): amenorrhea  Current complaints: excess hair growth, elevated BP.   Current acute medical issues:     Recent Gynecologic History Patient's last menstrual period was 09/04/2012. Last Pap: n/a,   Last mammogram: n/a,    Past Medical History:  Diagnosis Date  . Allergy   . Anxiety   . BV (bacterial vaginosis) 05/29/2015  . DDD (degenerative disc disease), lumbar   . Depression   . History of migraine headaches   . Hypertension   . Itching in the vaginal area 05/29/2015  . Morbid obesity ([facility])   . Recurrent cold sores 05/29/2015  . Vaginal discharge 05/29/2015    Past Surgical History:  Procedure Laterality Date  . ABDOMINAL HYSTERECTOMY    . CHOLECYSTECTOMY    . ENDOMETRIAL ABLATION    . LAPAROSCOPIC GASTRIC SLEEVE RESECTION    . LAPAROSCOPIC LYSIS OF ADHESIONS N/A 10/10/2013   Procedure: LAPAROSCOPIC LYSIS OF ADHESIONS;  Surgeon: Lazaro Arms, MD;  Location: [facility];  Service: Gynecology;  Laterality: N/A;  . LAPAROSCOPIC SALPINGO OOPHERECTOMY Left 10/10/2013   Procedure: LAPAROSCOPIC LEFT SALPINGO OOPHORECTOMY;  Surgeon: Lazaro Arms, MD;  Location: [facility];  Service: Gynecology;  Laterality: Left;  . TUBAL LIGATION      OB History    Gravida  4   Para  3   Term      Preterm      AB  1   Living  3     SAB  1   TAB      Ectopic      Multiple      Live Births              Social History   Socioeconomic History  . Marital status: Married    Spouse name: Not on file  . Number of children: Not on file  . Years of education: Not on file  . Highest education level: Not on file  Occupational History  . Not on file  Social Needs  . Financial resource strain: Not on file  . Food insecurity:    Worry: Not on file    Inability: Not on file  .  Transportation needs:    Medical: Not on file    Non-medical: Not on file  Tobacco Use  . Smoking status: Never Smoker  . Smokeless tobacco: Never Used  Substance and Sexual Activity  . Alcohol use: No  . Drug use: No  . Sexual activity: Yes    Birth control/protection: None, Surgical    Comment: hyst  Lifestyle  . Physical activity:    Days per week: Not on file    Minutes per session: Not on file  . Stress: Not on file  Relationships  . Social connections:    Talks on phone: Not on file    Gets together: Not on file    Attends religious service: Not on file    Active member of club or organization: Not on file    Attends meetings of clubs or organizations: Not on file    Relationship status: Not on file  Other Topics Concern  . Not on file  Social History Narrative   Marital Status: Married Film/video editor)   Children:  Son Janyth Pupa) Daughters ([place], [place])   Pets: Egbert Garibaldi  (1)    Living  Situation: Lives with husband and children     Occupation: Training and development officer  Administrator)     Education: Engineer, agricultural     Tobacco Use/Exposure:  None    Alcohol Use:  None    Drug Use:  None   Diet:  Regular   Exercise:  Walking (Twice per week)    Hobbies: Traveling          Family History  Problem Relation Age of Onset  . Diabetes Mother   . Hypertension Mother   . Cancer Mother        kidney  . Hypertension Father   . Depression Father   . Diabetes Maternal Grandmother   . COPD Maternal Grandmother   . Depression Paternal Grandmother      Current Outpatient Medications:  .  cetirizine (ZYRTEC) 10 MG tablet, Take 10 mg by mouth at bedtime., Disp: , Rfl:  .  diclofenac sodium (VOLTAREN) 1 % GEL, Apply 4 g topically 4 (four) times daily. (Patient not taking: Reported on 11/15/2017), Disp: 100 g, Rfl: 0 .  methocarbamol (ROBAXIN) 500 MG tablet, Take 1 tablet (500 mg total) by mouth at bedtime as needed for muscle spasms. (Patient not taking: Reported on 11/15/2017),  Disp: 20 tablet, Rfl: 0 .  metroNIDAZOLE (FLAGYL) 500 MG tablet, Take 1 tablet (500 mg total) by mouth 2 (two) times daily. (Patient not taking: Reported on 11/15/2017), Disp: 14 tablet, Rfl: 0 .  valACYclovir (VALTREX) 1000 MG tablet, Take 2 now and 2 tonight (Patient not taking: Reported on 11/15/2017), Disp: 12 tablet, Rfl: 1  Review of Systems  Review of Systems  Constitutional: Negative for fever, chills, weight loss, malaise/fatigue and diaphoresis.  HENT: Negative for hearing loss, ear pain, nosebleeds, congestion, sore throat, neck pain, tinnitus and ear discharge.   Eyes: Negative for blurred vision, double vision, photophobia, pain, discharge and redness.  Respiratory: Negative for cough, hemoptysis, sputum production, shortness of breath, wheezing and stridor.   Cardiovascular: Negative for chest pain, palpitations, orthopnea, claudication, leg swelling and PND.  Gastrointestinal: negative for abdominal pain. Negative for heartburn, nausea, vomiting, diarrhea, constipation, blood in stool and melena.  Genitourinary: Negative for dysuria, urgency, frequency, hematuria and flank pain.  Musculoskeletal: Negative for myalgias, back pain, joint pain and falls.  Skin: Negative for itching and rash.  Neurological: Negative for dizziness, tingling, tremors, sensory change, speech change, focal weakness, seizures, loss of consciousness, weakness and headaches.  Endo/Heme/Allergies: Negative for environmental allergies and polydipsia. Does not bruise/bleed easily.  Psychiatric/Behavioral: Negative for depression, suicidal ideas, hallucinations, memory loss and substance abuse. The patient is not nervous/anxious and does not have insomnia.        Objective:  Blood pressure (!) 144/103, pulse 75, height 5\' 3"  (1.6 m), weight 231 lb (104.8 kg), last menstrual period 09/04/2012.   Physical Exam  Vitals reviewed. Constitutional: She is oriented to person, place, and time. She appears  well-developed and well-nourished.  HENT:  Head: Normocephalic and atraumatic.        Right Ear: External ear normal.  Left Ear: External ear normal.  Nose: Nose normal.  Mouth/Throat: Oropharynx is clear and moist.  Eyes: Conjunctivae and EOM are normal. Pupils are equal, round, and reactive to light. Right eye exhibits no discharge. Left eye exhibits no discharge. No scleral icterus.  Neck: Normal range of motion. Neck supple. No tracheal deviation present. No thyromegaly present.  Cardiovascular: Normal rate, regular rhythm, normal heart sounds and intact distal pulses.  Exam reveals no  gallop and no friction rub.   No murmur heard. Respiratory: Effort normal and breath sounds normal. No respiratory distress. She has no wheezes. She has no rales. She exhibits no tenderness.  GI: Soft. Bowel sounds are normal. She exhibits no distension and no mass. There is no tenderness. There is no rebound and no guarding.  Genitourinary:  Breasts no masses skin changes or nipple changes bilaterally      Vulva is normal without lesions Vagina is pink moist without discharge Cervix absent Uterus is surgically absent Adnexa is negative   Musculoskeletal: Normal range of motion. She exhibits no edema and no tenderness.  Neurological: She is alert and oriented to person, place, and time. She has normal reflexes. She displays normal reflexes. No cranial nerve deficit. She exhibits normal muscle tone. Coordination normal.  Skin: Skin is warm and dry. No rash noted. No erythema. No pallor.  Psychiatric: She has a normal mood and affect. Her behavior is normal. Judgment and thought content normal.       Medications Ordered at today's visit: No orders of the defined types were placed in this encounter.   Other orders placed at today's visit: Orders Placed This Encounter  Procedures  . [facility]      Assessment:    Healthy female exam.    Plan:    Contraception: status post hysterectomy. Follow up  in: 2 years.   Take BP 4 times daily and keep log  Return in about 2 years (around 11/16/2019) for yearly.

## 2017-11-16 ENCOUNTER — Telehealth: Payer: Self-pay | Admitting: *Deleted

## 2017-11-16 LAB — FOLLICLE STIMULATING HORMONE: FSH: 6.1 m[IU]/mL

## 2017-11-16 NOTE — Telephone Encounter (Signed)
LMOVM for pt to call me regarding lab results.

## 2017-11-16 NOTE — Telephone Encounter (Signed)
DOB verified. Informed pt that [facility] was normal and she has excellent ovarian function. Pt verbalized understanding.

## 2017-11-17 ENCOUNTER — Encounter (HOSPITAL_COMMUNITY): Payer: Self-pay

## 2017-11-17 ENCOUNTER — Ambulatory Visit (HOSPITAL_COMMUNITY): Payer: 59 | Attending: Family Medicine

## 2017-11-17 DIAGNOSIS — M5416 Radiculopathy, lumbar region: Secondary | ICD-10-CM

## 2017-11-17 DIAGNOSIS — R262 Difficulty in walking, not elsewhere classified: Secondary | ICD-10-CM | POA: Diagnosis present

## 2017-11-17 NOTE — Therapy (Addendum)
[place], [place], [address] Phone: [phone]   Fax:  [phone]  Physical Therapy Treatment  Patient Details  Name: Crystal Rojas MRN: [id number] Date of Birth: 01-08-1981 Referring Provider (PT): Deri Fuelling   Encounter Date: 11/17/2017  PT End of Session - 11/17/17 1034    Visit Number  10    Number of Visits  18   Date for PT Re-Evaluation  11/21/17    Authorization Type  UHC    Authorization Time Period  08/26-->11/21/17    Authorization - Visit Number  10    Authorization - Number of Visits  60    PT Start Time  [phone]    PT Stop Time  1034    PT Time Calculation (min)  42 min    Activity Tolerance  Patient tolerated treatment well;Patient limited by pain;No increased pain    Behavior During Therapy  WFL for tasks assessed/performed       Past Medical History:  Diagnosis Date  . Allergy   . Anxiety   . BV (bacterial vaginosis) 05/29/2015  . DDD (degenerative disc disease), lumbar   . Depression   . History of migraine headaches   . Hypertension   . Itching in the vaginal area 05/29/2015  . Morbid obesity ([place])   . Recurrent cold sores 05/29/2015  . Vaginal discharge 05/29/2015    Past Surgical History:  Procedure Laterality Date  . ABDOMINAL HYSTERECTOMY    . CHOLECYSTECTOMY    . ENDOMETRIAL ABLATION    . LAPAROSCOPIC GASTRIC SLEEVE RESECTION    . LAPAROSCOPIC LYSIS OF ADHESIONS N/A 10/10/2013   Procedure: LAPAROSCOPIC LYSIS OF ADHESIONS;  Surgeon: Florian Buff, MD;  Location: [facility];  Service: Gynecology;  Laterality: N/A;  . LAPAROSCOPIC SALPINGO OOPHERECTOMY Left 10/10/2013   Procedure: LAPAROSCOPIC LEFT SALPINGO OOPHORECTOMY;  Surgeon: Florian Buff, MD;  Location: [facility];  Service: Gynecology;  Laterality: Left;  . TUBAL LIGATION      There were no vitals filed for this visit.  Subjective Assessment - 11/17/17 0956    Subjective  Pt stated she returned to work on Monday and its  been rough.  Pain scale 7/10.  She went to [place] asking about heel support, didn't have the size recommended, plans to search for     How long can you sit comfortably?  10/03: able to sit for 30 minutes but is constantly moving (was 15-20 minutes)    How long can you stand comfortably?  15-20 minutes but requires constant moving (was 5-10 minutes)    How long can you walk comfortably?  Able to walk for 30 minutes (was 5 minutes)    Patient Stated Goals  improved function and decreased pain.  Improved sleeping:  having difficulty getting to sleep: tosses and turns 2-3 hours prior to sleeping.      Currently in Pain?  Yes    Pain Score  7     Pain Location  Back    Pain Orientation  Lower;Left    Pain Descriptors / Indicators  Aching;Sore;Nagging    Pain Type  Chronic pain         [facility] PT Assessment - 11/17/17 0001      Assessment   Medical Diagnosis  LT lumbar radiculopathy    Referring Provider (PT)  Deri Fuelling    Onset Date/Surgical Date  07/29/17    Next MD Visit  4-6 weeks     Prior  Therapy  none      Observation/Other Assessments   Focus on Therapeutic Outcomes (FOTO)   39.66% (60% limited)   was 56% limited     Sit to Stand   Comments  5x 26.35" with hands on thighs   5x 41.21     Strength   Strength Assessment Site  Hip;Knee;Ankle    Right/Left Hip  Right;Left    Right Hip Flexion  4+/5   was 4/5   Right Hip Extension  4/5   was 4-/5   Right Hip ABduction  4+/5   was 4/5   Left Hip Flexion  4+/5   was 4/5   Left Hip Extension  4-/5   was 4-/5   Left Hip ABduction  4/5   was 3+/5   Right Knee Flexion  5/5    Right Knee Extension  5/5    Left Knee Flexion  4+/5   was 4+/5   Left Knee Extension  4+/5   was 4+/5.   Right Ankle Dorsiflexion  4+/5   was 4/5   Left Ankle Dorsiflexion  4+/5   was 4/5                  [facility] Adult PT Treatment/Exercise - 11/17/17 0001      Exercises   Exercises  Lumbar      Lumbar Exercises:  Supine   Ab Set  10 reps    AB Set Limitations  combined with glut sets     Bridge  10 reps   2 sets   Straight Leg Raise  15 reps    Straight Leg Raises Limitations  with ab set    Isometric Hip Flexion  10 reps;5 seconds    Large Ball Abdominal Isometric  10 reps;3 seconds    Large Ball Oblique Isometric  10 reps;3 seconds    Other Supine Lumbar Exercises  5 reps bridge Rt LE closer; LT SLS following MET      Manual Therapy   Manual Therapy  Manual Traction;Muscle Energy Technique    Manual therapy comments  Manual complete separate than rest of tx    Manual Traction  x 20 seconds    Muscle Energy Technique  For posterior rotation of Rt SI joint.               PT Short Term Goals - 11/17/17 1012      PT SHORT TERM GOAL #1   Title  Pt pain to be no greater than a 6/10 to allow pt to be able to sleep within 1.5 hours of going to sleep     Baseline  10/03:  Reports ability to go to sleep pretty fast, most pain in morinings9/16: sometimes, average over the last week has been taking her 1 hour, but sometimes it still takes her 2-3      PT SHORT TERM GOAL #2   Title  Pt to be able to sit for 30 minutes without experiencing increased pain to allow pt to eat a meal in comfort.     Baseline  10/03: able to sit for 15 minutes but does have pain 9/16: 5-10 mins      PT SHORT TERM GOAL #3   Title  PT to be able to stand for 15 minutes in comfort in order to shower without increased pain.     Baseline  10/03: able to stand for 15 minutes with minimal increased pain 9/16: 10-15 mins    Status  Partially Met      PT SHORT TERM GOAL #4   Title  Pt core and LE strength to be increased 1/2 grade to allow pt to come from sit to stand with ease.         PT Long Term Goals - 11/17/17 1014      PT LONG TERM GOAL #1   Title  Pt pain to be no greater than a 3/10 to allow pt to get to sleep within 45 minutes     Baseline  10/03: Reports ability to go to sleep faster, majority of pain in  morning; 9/16: sometimes, average over the last week has been taking her 1 hour, but sometimes it still takes her 2-3    Status  On-going      PT LONG TERM GOAL #2   Title  PT to be able to sit for an hour in comfort for job duties     Baseline  10/03: 15 minutes; 9/16: 5-10 mins      PT LONG TERM GOAL #3   Title  PT to be able to stand/walk for 45 mintues in comfort to be able to complete shopping tasks.     Baseline  10/03: 30 minutes; 9/16: 10-15 mins      PT LONG TERM GOAL #4   Title  PT core and mm strength  to be increased 1 grade to be able to go up a flight of steps without increased discomfort             Plan - 11/17/17 1246    Clinical Impression Statement  Continued MET for SI anterior rotation for pain control followed by abdominal strengthening exercises.  Reviewed goals wiht objective MMT and subjective information assessing progress with therapy.  Pt continues to have weakness in abdonminal and proximal musculature though is progressing.  Pt reports 50% improvements and improving with sitting, standing and walking tolerance.  FOTO reduced score from last time wiht 60% limitations.  EOS pt reports pain reduced to 6/10.      Rehab Potential  Good    PT Frequency  2x / week    PT Duration  6 weeks    PT Treatment/Interventions  ADLs/Self Care Home Management;Therapeutic activities;Therapeutic exercise;Balance training;Gait training;Stair training;Functional mobility training;Patient/family education;Manual techniques    PT Next Visit Plan  Continue for 4 more weeks to address goals unmet.  Progress stabiliztion and core strengthening as well as mobility.  Assess SI next treatment as well as instruct in t-band decompressive exercises     PT Home Exercise Plan  Sitting tall, ab set and scapular retraction; 10/20/17: hamstring stretch and clam; 9/16: postural theraband with GTB       Patient will benefit from skilled therapeutic intervention in order to improve the following  deficits and impairments:  Decreased activity tolerance, Decreased balance, Decreased mobility, Decreased range of motion, Decreased strength, Difficulty walking, Pain, Postural dysfunction  Visit Diagnosis: Difficulty in walking, not elsewhere classified  Radiculopathy, lumbar region     Problem List Patient Active Problem List   Diagnosis Date Noted  . Vaginal discharge 05/29/2015  . Itching in the vaginal area 05/29/2015  . BV (bacterial vaginosis) 05/29/2015  . Recurrent cold sores 05/29/2015  . Generalized anxiety disorder 06/12/2014    Class: Chronic  . Depression, major, recurrent, moderate ([place]) 06/12/2014    Class: Chronic  . Migraine headache 07/12/2012  . Anxiety state, unspecified 07/12/2012  . Morbid obesity ([place]) 07/12/2012   Ihor Austin, LPTA;  CBIS Krum, PT CLT [phone] 11/17/2017, 12:53 PM  [facility] [address] [place], [place], [address] Phone: [phone]   Fax:  [phone]  Name: Crystal Rojas MRN: [id number] Date of Birth: 05-12-1980

## 2017-11-17 NOTE — Addendum Note (Signed)
Addended by: Bella Kennedy on: 11/17/2017 01:54 PM   Modules accepted: Orders

## 2017-11-23 ENCOUNTER — Encounter (HOSPITAL_COMMUNITY): Payer: Self-pay

## 2017-11-23 ENCOUNTER — Ambulatory Visit (HOSPITAL_COMMUNITY): Payer: 59

## 2017-11-23 DIAGNOSIS — M5416 Radiculopathy, lumbar region: Secondary | ICD-10-CM

## 2017-11-23 DIAGNOSIS — R262 Difficulty in walking, not elsewhere classified: Secondary | ICD-10-CM

## 2017-11-23 NOTE — Therapy (Signed)
[place], [place], [address] Phone: [phone]   Fax:  [phone]  Physical Therapy Treatment  Patient Details  Name: Crystal Rojas MRN: [id number] Date of Birth: [birth year] Referring Provider (PT): Deri Fuelling   Encounter Date: 11/23/2017  PT End of Session - 11/23/17 1745    Visit Number  11    Number of Visits  18    Date for PT Re-Evaluation  12/15/17    Authorization Type  UHC    Authorization Time Period  Cert: 50/5-->39/76/73    Authorization - Visit Number  11    Authorization - Number of Visits  60    PT Start Time  4193    PT Stop Time  7902    PT Time Calculation (min)  44 min    Activity Tolerance  Patient tolerated treatment well;Patient limited by pain;No increased pain    Behavior During Therapy  WFL for tasks assessed/performed       Past Medical History:  Diagnosis Date  . Allergy   . Anxiety   . BV (bacterial vaginosis) 05/29/2015  . DDD (degenerative disc disease), lumbar   . Depression   . History of migraine headaches   . Hypertension   . Itching in the vaginal area 05/29/2015  . Morbid obesity ([place])   . Recurrent cold sores 05/29/2015  . Vaginal discharge 05/29/2015    Past Surgical History:  Procedure Laterality Date  . ABDOMINAL HYSTERECTOMY    . CHOLECYSTECTOMY    . ENDOMETRIAL ABLATION    . LAPAROSCOPIC GASTRIC SLEEVE RESECTION    . LAPAROSCOPIC LYSIS OF ADHESIONS N/A 10/10/2013   Procedure: LAPAROSCOPIC LYSIS OF ADHESIONS;  Surgeon: Florian Buff, MD;  Location: [facility];  Service: Gynecology;  Laterality: N/A;  . LAPAROSCOPIC SALPINGO OOPHERECTOMY Left 10/10/2013   Procedure: LAPAROSCOPIC LEFT SALPINGO OOPHORECTOMY;  Surgeon: Florian Buff, MD;  Location: [facility];  Service: Gynecology;  Laterality: Left;  . TUBAL LIGATION      There were no vitals filed for this visit.  Subjective Assessment - 11/23/17 1741    Subjective  Pt reports she RTW 3days a week starting  11/14/17, pt had to call MD and discuss due to increased pain and lumbar weakness.  Stated back feels stiff today and continues to have pain with sit to stand and just lifting Rt>Lt leg.      Patient Stated Goals  improved function and decreased pain.  Improved sleeping:  having difficulty getting to sleep: tosses and turns 2-3 hours prior to sleeping.      Currently in Pain?  Yes    Pain Score  6     Pain Location  Back    Pain Orientation  Lower   Lt>Rt   Pain Descriptors / Indicators  Aching;Dull;Sore;Nagging    Pain Type  Chronic pain    Pain Radiating Towards  No reports of radicular symptoms for over a week.    Pain Onset  More than a month ago    Pain Frequency  Constant    Aggravating Factors   activity    Pain Relieving Factors  change the position    Effect of Pain on Daily Activities  limits                       [facility] Adult PT Treatment/Exercise - 11/23/17 0001      Lumbar Exercises: Standing   Functional Squats  10 reps  Forward Lunge  10 reps    Shoulder Adduction Limitations  tandem stance palvo with RTB 4x 15 each direcoitn      Lumbar Exercises: Supine   Ab Set  10 reps    AB Set Limitations  combined with glut sets     Bent Knee Raise  10 reps;3 seconds    Bent Knee Raise Limitations  with theraband resistance BUE pull down    Dead Bug  10 reps;3 seconds    Other Supine Lumbar Exercises  1-4 decompression wiht RTB    Other Supine Lumbar Exercises  5 reps bridge Rt LE closer; LT SLS following MET      Manual Therapy   Manual Therapy  Manual Traction;Muscle Energy Technique    Manual therapy comments  Manual complete separate than rest of tx    Manual Traction  2x 30"    Muscle Energy Technique  For posterior rotation of Rt SI joint.               PT Short Term Goals - 11/17/17 1012      PT SHORT TERM GOAL #1   Title  Pt pain to be no greater than a 6/10 to allow pt to be able to sleep within 1.5 hours of going to sleep      Baseline  10/03:  Reports ability to go to sleep pretty fast, most pain in morinings9/16: sometimes, average over the last week has been taking her 1 hour, but sometimes it still takes her 2-3      PT SHORT TERM GOAL #2   Title  Pt to be able to sit for 30 minutes without experiencing increased pain to allow pt to eat a meal in comfort.     Baseline  10/03: able to sit for 15 minutes but does have pain 9/16: 5-10 mins      PT SHORT TERM GOAL #3   Title  PT to be able to stand for 15 minutes in comfort in order to shower without increased pain.     Baseline  10/03: able to stand for 15 minutes with minimal increased pain 9/16: 10-15 mins    Status  Partially Met      PT SHORT TERM GOAL #4   Title  Pt core and LE strength to be increased 1/2 grade to allow pt to come from sit to stand with ease.         PT Long Term Goals - 11/17/17 1014      PT LONG TERM GOAL #1   Title  Pt pain to be no greater than a 3/10 to allow pt to get to sleep within 45 minutes     Baseline  10/03: Reports ability to go to sleep faster, majority of pain in morning; 9/16: sometimes, average over the last week has been taking her 1 hour, but sometimes it still takes her 2-3    Status  On-going      PT LONG TERM GOAL #2   Title  PT to be able to sit for an hour in comfort for job duties     Baseline  10/03: 15 minutes; 9/16: 5-10 mins      PT LONG TERM GOAL #3   Title  PT to be able to stand/walk for 45 mintues in comfort to be able to complete shopping tasks.     Baseline  10/03: 30 minutes; 9/16: 10-15 mins      PT LONG TERM GOAL #4  Title  PT core and mm strength  to be increased 1 grade to be able to go up a flight of steps without increased discomfort             Plan - 11/23/17 1824    Clinical Impression Statement  Began session with MET for SI anterior rotation alignment and pain control.  Continueds with education on importance of abdominal strengthening.  Added decompression exercise wiht  theraband for postural strengthening.  Pt able to demonstrate good form with CKC, does continue to require cueing for abdominal contraction between exercises.  Reports pain reduced by 1-2 grade at EOS.    Rehab Potential  Good    PT Frequency  2x / week    PT Duration  6 weeks    PT Treatment/Interventions  ADLs/Self Care Home Management;Therapeutic activities;Therapeutic exercise;Balance training;Gait training;Stair training;Functional mobility training;Patient/family education;Manual techniques    PT Next Visit Plan   Progress stabiliztion and core strengthening as well as mobility.  Assess SI next treatment with MET PRN.      PT Home Exercise Plan  Sitting tall, ab set and scapular retraction; 10/20/17: hamstring stretch and clam; 9/16: postural theraband with GTB       Patient will benefit from skilled therapeutic intervention in order to improve the following deficits and impairments:  Decreased activity tolerance, Decreased balance, Decreased mobility, Decreased range of motion, Decreased strength, Difficulty walking, Pain, Postural dysfunction  Visit Diagnosis: Difficulty in walking, not elsewhere classified  Radiculopathy, lumbar region     Problem List Patient Active Problem List   Diagnosis Date Noted  . Vaginal discharge 05/29/2015  . Itching in the vaginal area 05/29/2015  . BV (bacterial vaginosis) 05/29/2015  . Recurrent cold sores 05/29/2015  . Generalized anxiety disorder 06/12/2014    Class: Chronic  . Depression, major, recurrent, moderate ([place]) 06/12/2014    Class: Chronic  . Migraine headache 07/12/2012  . Anxiety state, unspecified 07/12/2012  . Morbid obesity ([place]) 07/12/2012   Ihor Austin, [place]; [place]  Aldona Lento 11/23/2017, 6:28 PM  [facility] [address] [place], [place], [address] Phone: [phone]   Fax:  [phone]  Name: Crystal Rojas MRN: [id number] Date of Birth:  09-22-1980

## 2017-11-29 ENCOUNTER — Encounter (HOSPITAL_COMMUNITY): Payer: Self-pay | Admitting: Physical Therapy

## 2017-11-29 ENCOUNTER — Ambulatory Visit (HOSPITAL_COMMUNITY): Payer: 59 | Admitting: Physical Therapy

## 2017-11-29 DIAGNOSIS — R262 Difficulty in walking, not elsewhere classified: Secondary | ICD-10-CM | POA: Diagnosis not present

## 2017-11-29 DIAGNOSIS — M5416 Radiculopathy, lumbar region: Secondary | ICD-10-CM

## 2017-11-29 NOTE — Therapy (Signed)
[place], [place], [address] Phone: [phone]   Fax:  [phone]  Physical Therapy Treatment  Patient Details  Name: Crystal Rojas MRN: [id number] Date of Birth: [birth year] Referring Provider (PT): Deri Fuelling   Encounter Date: 11/29/2017  PT End of Session - 11/29/17 1154    Visit Number  12    Number of Visits  18    Date for PT Re-Evaluation  12/15/17    Authorization Type  UHC    Authorization Time Period  Cert: 97/0-->26/37/85    Authorization - Visit Number  12    Authorization - Number of Visits  60    PT Start Time  1130   pt 15 minutes late for session    PT Stop Time  1200    PT Time Calculation (min)  30 min    Activity Tolerance  Patient tolerated treatment well;Patient limited by pain;No increased pain    Behavior During Therapy  WFL for tasks assessed/performed       Past Medical History:  Diagnosis Date  . Allergy   . Anxiety   . BV (bacterial vaginosis) 05/29/2015  . DDD (degenerative disc disease), lumbar   . Depression   . History of migraine headaches   . Hypertension   . Itching in the vaginal area 05/29/2015  . Morbid obesity ([place])   . Recurrent cold sores 05/29/2015  . Vaginal discharge 05/29/2015    Past Surgical History:  Procedure Laterality Date  . ABDOMINAL HYSTERECTOMY    . CHOLECYSTECTOMY    . ENDOMETRIAL ABLATION    . LAPAROSCOPIC GASTRIC SLEEVE RESECTION    . LAPAROSCOPIC LYSIS OF ADHESIONS N/A 10/10/2013   Procedure: LAPAROSCOPIC LYSIS OF ADHESIONS;  Surgeon: Florian Buff, MD;  Location: [facility];  Service: Gynecology;  Laterality: N/A;  . LAPAROSCOPIC SALPINGO OOPHERECTOMY Left 10/10/2013   Procedure: LAPAROSCOPIC LEFT SALPINGO OOPHORECTOMY;  Surgeon: Florian Buff, MD;  Location: [facility];  Service: Gynecology;  Laterality: Left;  . TUBAL LIGATION      There were no vitals filed for this visit.  Subjective Assessment - 11/29/17 1129    Subjective  PT taken  back out of work due to increased pain when sitting.  Denies getting heel lift at this time     Limitations  Sitting;Lifting;Reading;Standing;Walking;House hold activities    How long can you sit comfortably?  15-20 minutes was 10  minutes     How long can you stand comfortably?  5-10 minutees     How long can you walk comfortably?  5 minutes     Patient Stated Goals  improved function and decreased pain.  Improved sleeping:  having difficulty getting to sleep: tosses and turns 2-3 hours prior to sleeping.      Currently in Pain?  Yes    Pain Score  6     Pain Location  Back    Pain Orientation  Lower    Pain Onset  More than a month ago    Pain Frequency  Intermittent    Aggravating Factors   doing anything for a long period of time     Pain Relieving Factors  lying down     Effect of Pain on Daily Activities  limits                       [facility] Adult PT Treatment/Exercise - 11/29/17 0001      Exercises  Exercises  Lumbar  Stretching knee to chest and active hamstring stretch x30 seconds x 2      Lumbar Exercises: Standing   Heel Raises  10 reps    Functional Squats  10 reps      Lumbar Exercises: Supine   Bent Knee Raise  10 reps;3 seconds    Bridge  5 reps    Bridge Limitations  lift rt then lt leg up       Lumbar Exercises: Sidelying   Clam  Both;10 reps    Hip Abduction  Both;10 reps      Lumbar Exercises: Prone   Single Arm Raises Limitations  double arm raise x 10    Straight Leg Raise  10 reps    Opposite Arm/Leg Raise  Right arm/Left leg;Left arm/Right leg;5 reps    Other Prone Lumbar Exercises  B shoulder extension x 10     Other Prone Lumbar Exercises  rows 2# x 10                PT Short Term Goals - 11/17/17 1012      PT SHORT TERM GOAL #1   Title  Pt pain to be no greater than a 6/10 to allow pt to be able to sleep within 1.5 hours of going to sleep     Baseline  10/03:  Reports ability to go to sleep pretty fast, most pain in  morinings9/16: sometimes, average over the last week has been taking her 1 hour, but sometimes it still takes her 2-3      PT SHORT TERM GOAL #2   Title  Pt to be able to sit for 30 minutes without experiencing increased pain to allow pt to eat a meal in comfort.     Baseline  10/03: able to sit for 15 minutes but does have pain 9/16: 5-10 mins      PT SHORT TERM GOAL #3   Title  PT to be able to stand for 15 minutes in comfort in order to shower without increased pain.     Baseline  10/03: able to stand for 15 minutes with minimal increased pain 9/16: 10-15 mins    Status  Partially Met      PT SHORT TERM GOAL #4   Title  Pt core and LE strength to be increased 1/2 grade to allow pt to come from sit to stand with ease.         PT Long Term Goals - 11/17/17 1014      PT LONG TERM GOAL #1   Title  Pt pain to be no greater than a 3/10 to allow pt to get to sleep within 45 minutes     Baseline  10/03: Reports ability to go to sleep faster, majority of pain in morning; 9/16: sometimes, average over the last week has been taking her 1 hour, but sometimes it still takes her 2-3    Status  On-going      PT LONG TERM GOAL #2   Title  PT to be able to sit for an hour in comfort for job duties     Baseline  10/03: 15 minutes; 9/16: 5-10 mins      PT LONG TERM GOAL #3   Title  PT to be able to stand/walk for 45 mintues in comfort to be able to complete shopping tasks.     Baseline  10/03: 30 minutes; 9/16: 10-15 mins      PT LONG  TERM GOAL #4   Title  PT core and mm strength  to be increased 1 grade to be able to go up a flight of steps without increased discomfort             Plan - 11/29/17 1155    Clinical Impression Statement  PT taken out of work as she is having increased pain with work duties.  Therapist added prone exercises to improve core strength.  Attempted ball core exercises as well as toe taps but pt abdominal mm are to weak for these exercises at this time.      Rehab  Potential  Good    PT Frequency  2x / week    PT Duration  6 weeks    PT Treatment/Interventions  ADLs/Self Care Home Management;Therapeutic activities;Therapeutic exercise;Balance training;Gait training;Stair training;Functional mobility training;Patient/family education;Manual techniques    PT Next Visit Plan   Progress stabiliztion and core strengthening as well as mobility.       PT Home Exercise Plan  Sitting tall, ab set and scapular retraction; 10/20/17: hamstring stretch and clam; 9/16: postural theraband with GTB       Patient will benefit from skilled therapeutic intervention in order to improve the following deficits and impairments:  Decreased activity tolerance, Decreased balance, Decreased mobility, Decreased range of motion, Decreased strength, Difficulty walking, Pain, Postural dysfunction  Visit Diagnosis: Difficulty in walking, not elsewhere classified  Radiculopathy, lumbar region     Problem List Patient Active Problem List   Diagnosis Date Noted  . Vaginal discharge 05/29/2015  . Itching in the vaginal area 05/29/2015  . BV (bacterial vaginosis) 05/29/2015  . Recurrent cold sores 05/29/2015  . Generalized anxiety disorder 06/12/2014    Class: Chronic  . Depression, major, recurrent, moderate ([place]) 06/12/2014    Class: Chronic  . Migraine headache 07/12/2012  . Anxiety state, unspecified 07/12/2012  . Morbid obesity [facility]) 07/12/2012    Rayetta Humphrey, PT CLT [phone] 11/29/2017, 11:59 AM  [facility] [address] [place], [place], [address] Phone: [phone]   Fax:  [phone]  Name: Crystal Rojas MRN: [id number] Date of Birth: [birth year]

## 2017-12-01 ENCOUNTER — Encounter (HOSPITAL_COMMUNITY): Payer: Self-pay

## 2017-12-01 ENCOUNTER — Ambulatory Visit (HOSPITAL_COMMUNITY): Payer: 59

## 2017-12-01 DIAGNOSIS — M5416 Radiculopathy, lumbar region: Secondary | ICD-10-CM

## 2017-12-01 DIAGNOSIS — R262 Difficulty in walking, not elsewhere classified: Secondary | ICD-10-CM

## 2017-12-01 NOTE — Therapy (Addendum)
[place], [place], [address] Phone: [phone]   Fax:  [phone]  Physical Therapy Treatment  Patient Details  Name: Crystal Rojas MRN: [id number] Date of Birth: 1980-10-21 Referring Provider (PT): Deri Fuelling   Encounter Date: 12/01/2017  PT End of Session - 12/01/17 1134    Visit Number  13    Number of Visits  18    Date for PT Re-Evaluation  12/15/17    Authorization Type  UHC    Authorization Time Period  Cert: 75/8-->83/25/49    PT Start Time  1125   pt late for apt   PT Stop Time  1201    PT Time Calculation (min)  36 min    Activity Tolerance  Patient tolerated treatment well;Patient limited by pain;No increased pain    Behavior During Therapy  WFL for tasks assessed/performed       Past Medical History:  Diagnosis Date  . Allergy   . Anxiety   . BV (bacterial vaginosis) 05/29/2015  . DDD (degenerative disc disease), lumbar   . Depression   . History of migraine headaches   . Hypertension   . Itching in the vaginal area 05/29/2015  . Morbid obesity ([place])   . Recurrent cold sores 05/29/2015  . Vaginal discharge 05/29/2015    Past Surgical History:  Procedure Laterality Date  . ABDOMINAL HYSTERECTOMY    . CHOLECYSTECTOMY    . ENDOMETRIAL ABLATION    . LAPAROSCOPIC GASTRIC SLEEVE RESECTION    . LAPAROSCOPIC LYSIS OF ADHESIONS N/A 10/10/2013   Procedure: LAPAROSCOPIC LYSIS OF ADHESIONS;  Surgeon: Florian Buff, MD;  Location: [facility];  Service: Gynecology;  Laterality: N/A;  . LAPAROSCOPIC SALPINGO OOPHERECTOMY Left 10/10/2013   Procedure: LAPAROSCOPIC LEFT SALPINGO OOPHORECTOMY;  Surgeon: Florian Buff, MD;  Location: [facility];  Service: Gynecology;  Laterality: Left;  . TUBAL LIGATION      There were no vitals filed for this visit.  Subjective Assessment - 12/01/17 1132    Subjective  Pt stated she continues to have high pain scale 7/10, stated the cold weather is tough.  Went to MD  yesterday, brought referal for TENS unit into dept.    Patient Stated Goals  improved function and decreased pain.  Improved sleeping:  having difficulty getting to sleep: tosses and turns 2-3 hours prior to sleeping.      Currently in Pain?  Yes    Pain Score  7     Pain Location  Back    Pain Orientation  Lower    Pain Descriptors / Indicators  Aching;Burning    Pain Type  Chronic pain    Pain Onset  More than a month ago    Pain Frequency  Intermittent    Aggravating Factors   doing anything for a long period of time    Pain Relieving Factors  lying down    Effect of Pain on Daily Activities  limits         [facility] PT Assessment - 12/01/17 0001      Assessment   Medical Diagnosis  LT lumbar radiculopathy    Referring Provider (PT)  Deri Fuelling    Onset Date/Surgical Date  07/29/17    Next MD Visit  12/28/17    Prior Therapy  none                   [facility] Adult PT Treatment/Exercise - 12/01/17 0001  Lumbar Exercises: Standing   Heel Raises  20 reps    Functional Squats  10 reps    Other Standing Lumbar Exercises  vector stance 5x 5"      Lumbar Exercises: Supine   Bent Knee Raise  10 reps;3 seconds    Bridge  10 reps    Bridge Limitations  bridge then bent knee raises x 3"      Lumbar Exercises: Sidelying   Clam  Both;10 reps;3 seconds   RTB   Hip Abduction  Both;10 reps      Lumbar Exercises: Prone   Opposite Arm/Leg Raise  Right arm/Left leg;Left arm/Right leg;10 reps;3 seconds    Other Prone Lumbar Exercises  B shoulder extension x 10     Other Prone Lumbar Exercises  rows 2# x 10              PT Education - 12/01/17 1141    Education Details  Educated purpose of TENS unit and given printout for example to purchase    Person(s) Educated  Patient    Methods  Explanation;Demonstration    Comprehension  Verbalized understanding       PT Short Term Goals - 11/17/17 1012      PT SHORT TERM GOAL #1   Title  Pt pain to be no greater than  a 6/10 to allow pt to be able to sleep within 1.5 hours of going to sleep     Baseline  10/03:  Reports ability to go to sleep pretty fast, most pain in morinings9/16: sometimes, average over the last week has been taking her 1 hour, but sometimes it still takes her 2-3      PT SHORT TERM GOAL #2   Title  Pt to be able to sit for 30 minutes without experiencing increased pain to allow pt to eat a meal in comfort.     Baseline  10/03: able to sit for 15 minutes but does have pain 9/16: 5-10 mins      PT SHORT TERM GOAL #3   Title  PT to be able to stand for 15 minutes in comfort in order to shower without increased pain.     Baseline  10/03: able to stand for 15 minutes with minimal increased pain 9/16: 10-15 mins    Status  Partially Met      PT SHORT TERM GOAL #4   Title  Pt core and LE strength to be increased 1/2 grade to allow pt to come from sit to stand with ease.         PT Long Term Goals - 11/17/17 1014      PT LONG TERM GOAL #1   Title  Pt pain to be no greater than a 3/10 to allow pt to get to sleep within 45 minutes     Baseline  10/03: Reports ability to go to sleep faster, majority of pain in morning; 9/16: sometimes, average over the last week has been taking her 1 hour, but sometimes it still takes her 2-3    Status  On-going      PT LONG TERM GOAL #2   Title  PT to be able to sit for an hour in comfort for job duties     Baseline  10/03: 15 minutes; 9/16: 5-10 mins      PT LONG TERM GOAL #3   Title  PT to be able to stand/walk for 45 mintues in comfort to be able to complete shopping  tasks.     Baseline  10/03: 30 minutes; 9/16: 10-15 mins      PT LONG TERM GOAL #4   Title  PT core and mm strength  to be increased 1 grade to be able to go up a flight of steps without increased discomfort             Plan - 12/01/17 1143    Clinical Impression Statement  Pt arrived with referal for TENS unit.  Pt explained purpose, placement and mechanics for use and  purchase with verbalize understanding.  Continued with abdominal strengthening exercises, attempted heel slides but abdominal musculature are too weak at this time.  Added vector stance for core activation and balance training.    Rehab Potential  Good    PT Frequency  2x / week    PT Duration  6 weeks    PT Treatment/Interventions  ADLs/Self Care Home Management;Therapeutic activities;Therapeutic exercise;Balance training;Gait training;Stair training;Functional mobility training;Patient/family education;Manual techniques    PT Next Visit Plan   Progress stabiliztion and core strengthening as well as mobility.       PT Home Exercise Plan  Sitting tall, ab set and scapular retraction; 10/20/17: hamstring stretch and clam; 9/16: postural theraband with GTB       Patient will benefit from skilled therapeutic intervention in order to improve the following deficits and impairments:  Decreased activity tolerance, Decreased balance, Decreased mobility, Decreased range of motion, Decreased strength, Difficulty walking, Pain, Postural dysfunction  Visit Diagnosis: Difficulty in walking, not elsewhere classified  Radiculopathy, lumbar region     Problem List Patient Active Problem List   Diagnosis Date Noted  . Vaginal discharge 05/29/2015  . Itching in the vaginal area 05/29/2015  . BV (bacterial vaginosis) 05/29/2015  . Recurrent cold sores 05/29/2015  . Generalized anxiety disorder 06/12/2014    Class: Chronic  . Depression, major, recurrent, moderate ([place]) 06/12/2014    Class: Chronic  . Migraine headache 07/12/2012  . Anxiety state, unspecified 07/12/2012  . Morbid obesity ([place]) 07/12/2012   Ihor Austin, [place]; [place]  Aldona Lento 12/01/2017, 12:07 PM  [place], [place], [address] Phone: [phone]   Fax:  [phone]  Name: TAHLOR BERENGUER MRN: [id number] Date of Birth: 08-29-1980

## 2017-12-06 ENCOUNTER — Ambulatory Visit (HOSPITAL_COMMUNITY): Payer: 59 | Admitting: Physical Therapy

## 2017-12-06 ENCOUNTER — Telehealth (HOSPITAL_COMMUNITY): Payer: Self-pay | Admitting: Physical Therapy

## 2017-12-06 NOTE — Telephone Encounter (Signed)
She is in too much pain to come in today, did too much walking yesterday and her back is hurting very bad today.

## 2017-12-08 ENCOUNTER — Ambulatory Visit (HOSPITAL_COMMUNITY): Payer: 59

## 2017-12-08 ENCOUNTER — Encounter (HOSPITAL_COMMUNITY): Payer: Self-pay

## 2017-12-08 DIAGNOSIS — R262 Difficulty in walking, not elsewhere classified: Secondary | ICD-10-CM | POA: Diagnosis not present

## 2017-12-08 DIAGNOSIS — M5416 Radiculopathy, lumbar region: Secondary | ICD-10-CM

## 2017-12-08 NOTE — Therapy (Signed)
[place], [place], [address] Phone: [phone]   Fax:  [phone]  Physical Therapy Treatment  Patient Details  Name: Crystal Rojas MRN: [id number] Date of Birth: [birth year] Referring Provider (PT): Deri Fuelling   Encounter Date: 12/08/2017  PT End of Session - 12/08/17 1051    Visit Number  14    Number of Visits  18    Date for PT Re-Evaluation  12/15/17    Authorization Type  UHC    Authorization Time Period  Cert: 26/3-->33/54/56    Authorization - Visit Number  14    Authorization - Number of Visits  60    PT Start Time  1034    PT Stop Time  1113    PT Time Calculation (min)  39 min    Activity Tolerance  Patient tolerated treatment well;Patient limited by pain;No increased pain    Behavior During Therapy  WFL for tasks assessed/performed       Past Medical History:  Diagnosis Date  . Allergy   . Anxiety   . BV (bacterial vaginosis) 05/29/2015  . DDD (degenerative disc disease), lumbar   . Depression   . History of migraine headaches   . Hypertension   . Itching in the vaginal area 05/29/2015  . Morbid obesity ([place])   . Recurrent cold sores 05/29/2015  . Vaginal discharge 05/29/2015    Past Surgical History:  Procedure Laterality Date  . ABDOMINAL HYSTERECTOMY    . CHOLECYSTECTOMY    . ENDOMETRIAL ABLATION    . LAPAROSCOPIC GASTRIC SLEEVE RESECTION    . LAPAROSCOPIC LYSIS OF ADHESIONS N/A 10/10/2013   Procedure: LAPAROSCOPIC LYSIS OF ADHESIONS;  Surgeon: Florian Buff, MD;  Location: [facility];  Service: Gynecology;  Laterality: N/A;  . LAPAROSCOPIC SALPINGO OOPHERECTOMY Left 10/10/2013   Procedure: LAPAROSCOPIC LEFT SALPINGO OOPHORECTOMY;  Surgeon: Florian Buff, MD;  Location: [facility];  Service: Gynecology;  Laterality: Left;  . TUBAL LIGATION      There were no vitals filed for this visit.  Subjective Assessment - 12/08/17 1035    Subjective  Pt reports her mother had TENS unit, has  tried it out and reports it feels good during.  Current pain scale 5/10 LBP today.    Patient Stated Goals  improved function and decreased pain.  Improved sleeping:  having difficulty getting to sleep: tosses and turns 2-3 hours prior to sleeping.      Currently in Pain?  Yes    Pain Score  5     Pain Location  Back    Pain Orientation  Lower    Pain Descriptors / Indicators  Aching;Sore    Pain Type  Chronic pain    Pain Onset  More than a month ago    Pain Frequency  Intermittent    Aggravating Factors   doing anything for a long period of time    Pain Relieving Factors  lying down    Effect of Pain on Daily Activities  limits         [facility] PT Assessment - 12/08/17 0001      Assessment   Medical Diagnosis  LT lumbar radiculopathy    Referring Provider (PT)  Deri Fuelling    Onset Date/Surgical Date  07/29/17    Next MD Visit  12/28/17    Prior Therapy  none  [facility] Adult PT Treatment/Exercise - 12/08/17 0001      Exercises   Exercises  Lumbar      Lumbar Exercises: Stretches   Single Knee to Chest Stretch  Right;Left;2 reps;30 seconds      Lumbar Exercises: Standing   Heel Raises  10 reps    Heel Raises Limitations  squat then heel raise no HHA    Functional Squats  10 reps    Functional Squats Limitations  squat then heel raise no HHA    Other Standing Lumbar Exercises  palvo in tandem stance GTB 15x each direction (4way); marching with opposite UE/LE hip flexion    Other Standing Lumbar Exercises  vector stance 5x 5"      Lumbar Exercises: Supine   Bridge  10 reps    Bridge Limitations  bridge then bent knee raises x 3"    Other Supine Lumbar Exercises  purple band pull down with bent knee raise 10x 5" holds      Lumbar Exercises: Sidelying   Clam  15 reps    Clam Limitations  RTB 5" holds    Hip Abduction  15 reps      Lumbar Exercises: Prone   Opposite Arm/Leg Raise  Right arm/Left leg;Left arm/Right leg;10 reps;3 seconds     Other Prone Lumbar Exercises  3x 10" planks on knee iwht ab set    Other Prone Lumbar Exercises  rows 3# x 15; B shoulder extension x 10 w/ 3#               PT Short Term Goals - 11/17/17 1012      PT SHORT TERM GOAL #1   Title  Pt pain to be no greater than a 6/10 to allow pt to be able to sleep within 1.5 hours of going to sleep     Baseline  10/03:  Reports ability to go to sleep pretty fast, most pain in morinings9/16: sometimes, average over the last week has been taking her 1 hour, but sometimes it still takes her 2-3      PT SHORT TERM GOAL #2   Title  Pt to be able to sit for 30 minutes without experiencing increased pain to allow pt to eat a meal in comfort.     Baseline  10/03: able to sit for 15 minutes but does have pain 9/16: 5-10 mins      PT SHORT TERM GOAL #3   Title  PT to be able to stand for 15 minutes in comfort in order to shower without increased pain.     Baseline  10/03: able to stand for 15 minutes with minimal increased pain 9/16: 10-15 mins    Status  Partially Met      PT SHORT TERM GOAL #4   Title  Pt core and LE strength to be increased 1/2 grade to allow pt to come from sit to stand with ease.         PT Long Term Goals - 11/17/17 1014      PT LONG TERM GOAL #1   Title  Pt pain to be no greater than a 3/10 to allow pt to get to sleep within 45 minutes     Baseline  10/03: Reports ability to go to sleep faster, majority of pain in morning; 9/16: sometimes, average over the last week has been taking her 1 hour, but sometimes it still takes her 2-3    Status  On-going  PT LONG TERM GOAL #2   Title  PT to be able to sit for an hour in comfort for job duties     Baseline  10/03: 15 minutes; 9/16: 5-10 mins      PT LONG TERM GOAL #3   Title  PT to be able to stand/walk for 45 mintues in comfort to be able to complete shopping tasks.     Baseline  10/03: 30 minutes; 9/16: 10-15 mins      PT LONG TERM GOAL #4   Title  PT core and mm  strength  to be increased 1 grade to be able to go up a flight of steps without increased discomfort             Plan - 12/08/17 1100    Clinical Impression Statement  Continued session focus with abdominal and proximal strengthening.  Increased balance activities for core activaiton with marching UE and opposite LE holds, palvo exercises on dynamic surface with theraband resistance and modified plank.  Pt able to complete though did require verbal and tactile cueing to improve abdominal activation.  No reports of increased pain, was limited by fatigue with tasks.      Rehab Potential  Good    PT Frequency  2x / week    PT Duration  6 weeks    PT Treatment/Interventions  ADLs/Self Care Home Management;Therapeutic activities;Therapeutic exercise;Balance training;Gait training;Stair training;Functional mobility training;Patient/family education;Manual techniques    PT Next Visit Plan   Progress stabiliztion and core strengthening as well as mobility.       PT Home Exercise Plan  Sitting tall, ab set and scapular retraction; 10/20/17: hamstring stretch and clam; 9/16: postural theraband with GTB       Patient will benefit from skilled therapeutic intervention in order to improve the following deficits and impairments:  Decreased activity tolerance, Decreased balance, Decreased mobility, Decreased range of motion, Decreased strength, Difficulty walking, Pain, Postural dysfunction  Visit Diagnosis: Difficulty in walking, not elsewhere classified  Radiculopathy, lumbar region     Problem List Patient Active Problem List   Diagnosis Date Noted  . Vaginal discharge 05/29/2015  . Itching in the vaginal area 05/29/2015  . BV (bacterial vaginosis) 05/29/2015  . Recurrent cold sores 05/29/2015  . Generalized anxiety disorder 06/12/2014    Class: Chronic  . Depression, major, recurrent, moderate ([place]) 06/12/2014    Class: Chronic  . Migraine headache 07/12/2012  . Anxiety state,  unspecified 07/12/2012  . Morbid obesity ([place]) 07/12/2012   Ihor Austin, [place]; [place]  Aldona Lento 12/08/2017, 6:36 PM  [facility] [address] [place], [place], [address] Phone: [phone]   Fax:  [phone]  Name: Crystal Rojas MRN: [id number] Date of Birth: 1981/02/08

## 2017-12-13 ENCOUNTER — Ambulatory Visit (HOSPITAL_COMMUNITY): Payer: 59

## 2017-12-13 ENCOUNTER — Encounter (HOSPITAL_COMMUNITY): Payer: Self-pay

## 2017-12-13 DIAGNOSIS — R262 Difficulty in walking, not elsewhere classified: Secondary | ICD-10-CM | POA: Diagnosis not present

## 2017-12-13 DIAGNOSIS — M5416 Radiculopathy, lumbar region: Secondary | ICD-10-CM

## 2017-12-13 NOTE — Therapy (Signed)
[place], [place], [address] Phone: [phone]   Fax:  [phone]  Physical Therapy Treatment  Patient Details  Name: MONAYE BLACKIE MRN: [id number] Date of Birth: 1980-07-27 Referring Provider (PT): Deri Fuelling   Encounter Date: 12/13/2017  PT End of Session - 12/13/17 1122    Visit Number  15    Number of Visits  18    Date for PT Re-Evaluation  12/15/17    Authorization Type  UHC    Authorization Time Period  Cert: 06/3-->01/60/10    Authorization - Visit Number  15    Authorization - Number of Visits  60    PT Start Time  9323   pt arrived late   PT Stop Time  1203    PT Time Calculation (min)  40 min    Activity Tolerance  Patient tolerated treatment well;Patient limited by pain;No increased pain    Behavior During Therapy  WFL for tasks assessed/performed       Past Medical History:  Diagnosis Date  . Allergy   . Anxiety   . BV (bacterial vaginosis) 05/29/2015  . DDD (degenerative disc disease), lumbar   . Depression   . History of migraine headaches   . Hypertension   . Itching in the vaginal area 05/29/2015  . Morbid obesity ([place])   . Recurrent cold sores 05/29/2015  . Vaginal discharge 05/29/2015    Past Surgical History:  Procedure Laterality Date  . ABDOMINAL HYSTERECTOMY    . CHOLECYSTECTOMY    . ENDOMETRIAL ABLATION    . LAPAROSCOPIC GASTRIC SLEEVE RESECTION    . LAPAROSCOPIC LYSIS OF ADHESIONS N/A 10/10/2013   Procedure: LAPAROSCOPIC LYSIS OF ADHESIONS;  Surgeon: Florian Buff, MD;  Location: [facility];  Service: Gynecology;  Laterality: N/A;  . LAPAROSCOPIC SALPINGO OOPHERECTOMY Left 10/10/2013   Procedure: LAPAROSCOPIC LEFT SALPINGO OOPHORECTOMY;  Surgeon: Florian Buff, MD;  Location: [facility];  Service: Gynecology;  Laterality: Left;  . TUBAL LIGATION      There were no vitals filed for this visit.  Subjective Assessment - 12/13/17 1125    Subjective  Pt reports that her back is  tight. She states that she's in pain but it's mostly tightness.    Patient Stated Goals  improved function and decreased pain.  Improved sleeping:  having difficulty getting to sleep: tosses and turns 2-3 hours prior to sleeping.      Currently in Pain?  Yes    Pain Score  5     Pain Location  Back    Pain Orientation  Lower    Pain Descriptors / Indicators  Tightness    Pain Type  Chronic pain    Pain Onset  More than a month ago    Pain Frequency  Intermittent    Aggravating Factors   doing anything for a long period of time    Pain Relieving Factors  lying down    Effect of Pain on Daily Activities  limits            [facility] Adult PT Treatment/Exercise - 12/13/17 0001      Exercises   Exercises  Lumbar      Lumbar Exercises: Stretches   Single Knee to Chest Stretch  Right;Left;2 reps;30 seconds    Lower Trunk Rotation  5 reps;10 seconds    Lower Trunk Rotation Limitations  bil      Lumbar Exercises: Standing   Shoulder Adduction Limitations  farmer's carries 10#DB, x1lap each UE      Lumbar Exercises: Supine   Bridge  10 reps    Bridge Limitations  bosu dome up, 2 sets    Single Leg Bridge  5 reps   2 sets, BLE     Lumbar Exercises: Quadruped   Opposite Arm/Leg Raise  Right arm/Left leg;Left arm/Right leg;10 reps    Opposite Arm/Leg Raise Limitations  3-5" holds, tissue box as external cue for core stabilization    Other Quadruped Lumbar Exercises  firehydrants 2x10 reps each      Manual Therapy   Manual Therapy  Soft tissue mobilization    Manual therapy comments  Manual complete separate than rest of tx    Soft tissue mobilization  bil lumbar paraspinals and thoracolumbar fascia, L>R             PT Education - 12/13/17 1123    Education Details  xercise technique, will reassess next visit    Person(s) Educated  Patient    Methods  Explanation;Demonstration    Comprehension  Verbalized understanding;Returned demonstration       PT Short Term Goals -  11/17/17 1012      PT SHORT TERM GOAL #1   Title  Pt pain to be no greater than a 6/10 to allow pt to be able to sleep within 1.5 hours of going to sleep     Baseline  10/03:  Reports ability to go to sleep pretty fast, most pain in morinings9/16: sometimes, average over the last week has been taking her 1 hour, but sometimes it still takes her 2-3      PT SHORT TERM GOAL #2   Title  Pt to be able to sit for 30 minutes without experiencing increased pain to allow pt to eat a meal in comfort.     Baseline  10/03: able to sit for 15 minutes but does have pain 9/16: 5-10 mins      PT SHORT TERM GOAL #3   Title  PT to be able to stand for 15 minutes in comfort in order to shower without increased pain.     Baseline  10/03: able to stand for 15 minutes with minimal increased pain 9/16: 10-15 mins    Status  Partially Met      PT SHORT TERM GOAL #4   Title  Pt core and LE strength to be increased 1/2 grade to allow pt to come from sit to stand with ease.         PT Long Term Goals - 11/17/17 1014      PT LONG TERM GOAL #1   Title  Pt pain to be no greater than a 3/10 to allow pt to get to sleep within 45 minutes     Baseline  10/03: Reports ability to go to sleep faster, majority of pain in morning; 9/16: sometimes, average over the last week has been taking her 1 hour, but sometimes it still takes her 2-3    Status  On-going      PT LONG TERM GOAL #2   Title  PT to be able to sit for an hour in comfort for job duties     Baseline  10/03: 15 minutes; 9/16: 5-10 mins      PT LONG TERM GOAL #3   Title  PT to be able to stand/walk for 45 mintues in comfort to be able to complete shopping tasks.     Baseline  10/03:  30 minutes; 9/16: 10-15 mins      PT LONG TERM GOAL #4   Title  PT core and mm strength  to be increased 1 grade to be able to go up a flight of steps without increased discomfort             Plan - 12/13/17 1206    Clinical Impression Statement  Continued with  established POC focusing on general lumbar mobility and core stabilization and strengthening. Progressed bridging to feet on bosu and introduced her to single leg bridging this date. Also introduced pt to quadruped work in order to progress core stabilization; min cues for form throughout but no reports of increased pain. Assessed lumbar paraspinals for restrictions since pt reporting continued pain in this area; noted increased restrictions and palpable knot mainly on L side, and palpation throughout recreated her pain. Ended with STM to this mm to facilitate reduced pain and restrictions. She reported feeling less tight at EOS. Pt due for reassessment next session.    Rehab Potential  Good    PT Frequency  2x / week    PT Duration  6 weeks    PT Treatment/Interventions  ADLs/Self Care Home Management;Therapeutic activities;Therapeutic exercise;Balance training;Gait training;Stair training;Functional mobility training;Patient/family education;Manual techniques    PT Next Visit Plan  reassessment; contiue manual PRN;  Progress stabiliztion and core strengthening as well as mobility.       PT Home Exercise Plan  Sitting tall, ab set and scapular retraction; 10/20/17: hamstring stretch and clam; 9/16: postural theraband with GTB    Consulted and Agree with Plan of Care  Patient       Patient will benefit from skilled therapeutic intervention in order to improve the following deficits and impairments:  Decreased activity tolerance, Decreased balance, Decreased mobility, Decreased range of motion, Decreased strength, Difficulty walking, Pain, Postural dysfunction  Visit Diagnosis: Difficulty in walking, not elsewhere classified  Radiculopathy, lumbar region     Problem List Patient Active Problem List   Diagnosis Date Noted  . Vaginal discharge 05/29/2015  . Itching in the vaginal area 05/29/2015  . BV (bacterial vaginosis) 05/29/2015  . Recurrent cold sores 05/29/2015  . Generalized anxiety  disorder 06/12/2014    Class: Chronic  . Depression, major, recurrent, moderate ([place]) 06/12/2014    Class: Chronic  . Migraine headache 07/12/2012  . Anxiety state, unspecified 07/12/2012  . Morbid obesity ([place]) 07/12/2012        Geraldine Solar PT, [place] [address] [place], [place], [address] Phone: [phone]   Fax:  [phone]  Name: ALIANYS CHACKO MRN: [id number] Date of Birth: March 27, 1980

## 2017-12-15 ENCOUNTER — Ambulatory Visit (HOSPITAL_COMMUNITY): Payer: 59

## 2017-12-15 ENCOUNTER — Encounter (HOSPITAL_COMMUNITY): Payer: Self-pay

## 2017-12-15 DIAGNOSIS — R262 Difficulty in walking, not elsewhere classified: Secondary | ICD-10-CM

## 2017-12-15 DIAGNOSIS — M5416 Radiculopathy, lumbar region: Secondary | ICD-10-CM

## 2017-12-15 NOTE — Therapy (Signed)
[place], [place], [address] Phone: [phone]   Fax:  [phone]  Physical Therapy Treatment  Patient Details  Name: Crystal Rojas MRN: [id number] Date of Birth: 01/31/1981 Referring Provider (PT): Deri Fuelling   Encounter Date: 12/15/2017  PT End of Session - 12/15/17 1511    Visit Number  16    Number of Visits  22    Date for PT Re-Evaluation  12/29/17    Authorization Type  [employer]    Authorization Time Period  Cert: 48/5-->46/27/03; NEW: 12/15/17 to 12/29/17    Authorization - Visit Number  16    Authorization - Number of Visits  60    PT Start Time  5009    PT Stop Time  3818    PT Time Calculation (min)  42 min    Activity Tolerance  Patient tolerated treatment well;Patient limited by pain;No increased pain    Behavior During Therapy  WFL for tasks assessed/performed       Past Medical History:  Diagnosis Date  . Allergy   . Anxiety   . BV (bacterial vaginosis) 05/29/2015  . DDD (degenerative disc disease), lumbar   . Depression   . History of migraine headaches   . Hypertension   . Itching in the vaginal area 05/29/2015  . Morbid obesity ([place])   . Recurrent cold sores 05/29/2015  . Vaginal discharge 05/29/2015    Past Surgical History:  Procedure Laterality Date  . ABDOMINAL HYSTERECTOMY    . CHOLECYSTECTOMY    . ENDOMETRIAL ABLATION    . LAPAROSCOPIC GASTRIC SLEEVE RESECTION    . LAPAROSCOPIC LYSIS OF ADHESIONS N/A 10/10/2013   Procedure: LAPAROSCOPIC LYSIS OF ADHESIONS;  Surgeon: Florian Buff, MD;  Location: [facility];  Service: Gynecology;  Laterality: N/A;  . LAPAROSCOPIC SALPINGO OOPHERECTOMY Left 10/10/2013   Procedure: LAPAROSCOPIC LEFT SALPINGO OOPHORECTOMY;  Surgeon: Florian Buff, MD;  Location: [facility];  Service: Gynecology;  Laterality: Left;  . TUBAL LIGATION      There were no vitals filed for this visit.  Subjective Assessment - 12/15/17 1511    Subjective  Pt states that she  was pretty tender following the manual therapy. She is better today but yesterday it felt like it was bruised.     Patient Stated Goals  improved function and decreased pain.  Improved sleeping:  having difficulty getting to sleep: tosses and turns 2-3 hours prior to sleeping.      Currently in Pain?  Yes    Pain Score  5     Pain Location  Back    Pain Orientation  Lower    Pain Descriptors / Indicators  Tightness    Pain Type  Chronic pain    Pain Onset  More than a month ago    Pain Frequency  Intermittent    Aggravating Factors   doing anything for a long period of time    Pain Relieving Factors  lying down    Effect of Pain on Daily Activities  limits         [facility] PT Assessment - 12/15/17 0001      Assessment   Medical Diagnosis  LT lumbar radiculopathy    Referring Provider (PT)  Deri Fuelling    Onset Date/Surgical Date  07/29/17    Next MD Visit  12/28/17    Prior Therapy  none      Strength   Right Hip Flexion  5/5  was 4+   Right Hip Extension  4+/5   was 4   Right Hip ABduction  4+/5   was 4+   Left Hip Flexion  5/5   was 4+   Left Hip Extension  4/5   was 4-   Left Hip ABduction  4/5   was 4   Left Knee Flexion  4+/5   was 4+   Left Knee Extension  5/5   was 4+   Right Ankle Dorsiflexion  5/5   was 4+   Left Ankle Dorsiflexion  4+/5   was 4+            [facility] Adult PT Treatment/Exercise - 12/15/17 0001      Lumbar Exercises: Quadruped   Opposite Arm/Leg Raise  Right arm/Left leg;Left arm/Right leg;10 reps    Opposite Arm/Leg Raise Limitations  3-5" holds, tissue box as external cue for core stabilization    Other Quadruped Lumbar Exercises  firehydrants 2x10 reps each      Manual Therapy   Manual Therapy  Soft tissue mobilization    Manual therapy comments  Manual complete separate than rest of tx    Soft tissue mobilization  very light and light STM to bil lumbar paraspinals and thoracolumbar fascia, L>R           PT Education -  12/15/17 1511    Education Details  reassessment findings    Person(s) Educated  Patient    Methods  Explanation;Demonstration    Comprehension  Verbalized understanding;Returned demonstration       PT Short Term Goals - 12/15/17 1514      PT SHORT TERM GOAL #1   Title  Pt pain to be no greater than a 6/10 to allow pt to be able to sleep within 1.5 hours of going to sleep     Baseline  10/31: pain is up and down, can get up to 7-8/10 and averages 6/10 daily basis; falls asleep wihtin 37mns usually    Time  3    Period  Weeks    Status  Partially Met      PT SHORT TERM GOAL #2   Title  Pt to be able to sit for 30 minutes without experiencing increased pain to allow pt to eat a meal in comfort.     Baseline  10/31: 30-460ms    Time  3    Period  Weeks    Status  Achieved      PT SHORT TERM GOAL #3   Title  PT to be able to stand for 15 minutes in comfort in order to shower without increased pain.     Baseline  10/31: 30-4547m comfortably, shifting weight    Time  3    Period  Weeks    Status  Achieved      PT SHORT TERM GOAL #4   Title  Pt core and LE strength to be increased 1/2 grade to allow pt to come from sit to stand with ease.     Time  3    Period  Weeks    Status  Partially Met        PT Long Term Goals - 12/15/17 1517      PT LONG TERM GOAL #1   Title  Pt pain to be no greater than a 3/10 to allow pt to get to sleep within 45 minutes     Baseline  10/31: pain is up and down, can get up  to 7-8/10 and averages 6/10 daily basis; falls asleep wihtin 31mns usually    Time  6    Period  Weeks    Status  Partially Met      PT LONG TERM GOAL #2   Title  PT to be able to sit for an hour in comfort for job duties     Baseline  10/31: 30-434ms    Time  6    Period  Weeks    Status  On-going      PT LONG TERM GOAL #3   Title  PT to be able to stand/walk for 45 mintues in comfort to be able to complete shopping tasks.     Baseline  10/31: 30-4568m  comfortably, shifting weight; walking 84m98m- 1 hour and it is pain relieving factor    Time  6    Period  Weeks    Status  Achieved      PT LONG TERM GOAL #4   Title  PT core and mm strength  to be increased 1 grade to be able to go up a flight of steps without increased discomfort     Baseline  10/31: still discomfort with stairs    Time  6    Period  Weeks    Status  Partially Met            Plan - 12/15/17 1603    Clinical Impression Statement  PT reassessed pt's goals and outcome measures this date. Pt has overall made really good progress towards her goals as illustrated above. Her MMT has improved but her LLE, mainly at proximal hip, is still slightly limited in strength and she subjectively reports continued difficulty with functional tasks such as ambulating stairs. She does feel that therapy has improved her balance and flexibility but states that her main issue remaining is her LBP. She denies anymore LLE radicular pain and states that it is located across her lower back. She is still limited functionally with her sitting as she cannot sit for >84mi20mnd her job duties require her to sit all day. PT feels pt LBP is likely due to continued deficits in core strength and soft tissue restrictions noted along lumbar paraspinals. PT recommending brief extension for 2x/week for 2 weeks to begin to build up a solid core, hip, and functional HEP in order for her to manage her symptoms independently and to allow pt to follow up with Dr. PooleTrenton Gammon1/13/19. Her HEP should be updated each session going forward.     Rehab Potential  Good    PT Frequency  2x / week    PT Duration  2 weeks    PT Treatment/Interventions  ADLs/Self Care Home Management;Therapeutic activities;Therapeutic exercise;Balance training;Gait training;Stair training;Functional mobility training;Patient/family education;Manual techniques;Cryotherapy;Moist Heat;Dry needling;Scar mobilization;Taping;Aquatic Therapy    PT  Next Visit Plan  Continue to progress stabiliztion and core strengthening as well as mobility, updating HEP each session; contiue manual PRN;      PT Home Exercise Plan  Sitting tall, ab set and scapular retraction; 10/20/17: hamstring stretch and clam; 9/16: postural theraband with GTB; 10/31: qped firehydrants    Consulted and Agree with Plan of Care  Patient       Patient will benefit from skilled therapeutic intervention in order to improve the following deficits and impairments:  Decreased activity tolerance, Decreased balance, Decreased mobility, Decreased range of motion, Decreased strength, Difficulty walking, Pain, Postural dysfunction  Visit Diagnosis: Difficulty in walking, not elsewhere classified -  Plan: PT plan of care cert/re-cert, CANCELED: PT plan of care cert/re-cert  Radiculopathy, lumbar region - Plan: PT plan of care cert/re-cert, CANCELED: PT plan of care cert/re-cert     Problem List Patient Active Problem List   Diagnosis Date Noted  . Vaginal discharge 05/29/2015  . Itching in the vaginal area 05/29/2015  . BV (bacterial vaginosis) 05/29/2015  . Recurrent cold sores 05/29/2015  . Generalized anxiety disorder 06/12/2014    Class: Chronic  . Depression, major, recurrent, moderate ([place]) 06/12/2014    Class: Chronic  . Migraine headache 07/12/2012  . Anxiety state, unspecified 07/12/2012  . Morbid obesity ([place]) 07/12/2012        Geraldine Solar PT, [place] [address] [place], [place], [address] Phone: [phone]   Fax:  [phone]  Name: ALYZABETH PONTILLO MRN: [id number] Date of Birth: 01-09-1981

## 2017-12-20 ENCOUNTER — Ambulatory Visit (HOSPITAL_COMMUNITY): Payer: 59 | Attending: Family Medicine | Admitting: Physical Therapy

## 2017-12-20 DIAGNOSIS — R262 Difficulty in walking, not elsewhere classified: Secondary | ICD-10-CM | POA: Diagnosis present

## 2017-12-20 DIAGNOSIS — M5416 Radiculopathy, lumbar region: Secondary | ICD-10-CM | POA: Diagnosis present

## 2017-12-20 NOTE — Therapy (Signed)
[place], [place], [address] Phone: [phone]   Fax:  [phone]  Physical Therapy Treatment  Patient Details  Name: Crystal Rojas MRN: [id number] Date of Birth: 02/09/1981 Referring Provider (PT): Deri Fuelling   Encounter Date: 12/20/2017  PT End of Session - 12/20/17 1157    Visit Number  17    Number of Visits  22    Date for PT Re-Evaluation  12/29/17    Authorization Type  [employer]    Authorization Time Period  Cert: 67/6-->19/50/93; NEW: 12/15/17 to 12/29/17    Authorization - Visit Number  17    Authorization - Number of Visits  60    PT Start Time  2671    PT Stop Time  1205    PT Time Calculation (min)  42 min    Activity Tolerance  Patient tolerated treatment well;Patient limited by pain;No increased pain    Behavior During Therapy  WFL for tasks assessed/performed       Past Medical History:  Diagnosis Date  . Allergy   . Anxiety   . BV (bacterial vaginosis) 05/29/2015  . DDD (degenerative disc disease), lumbar   . Depression   . History of migraine headaches   . Hypertension   . Itching in the vaginal area 05/29/2015  . Morbid obesity ([place])   . Recurrent cold sores 05/29/2015  . Vaginal discharge 05/29/2015    Past Surgical History:  Procedure Laterality Date  . ABDOMINAL HYSTERECTOMY    . CHOLECYSTECTOMY    . ENDOMETRIAL ABLATION    . LAPAROSCOPIC GASTRIC SLEEVE RESECTION    . LAPAROSCOPIC LYSIS OF ADHESIONS N/A 10/10/2013   Procedure: LAPAROSCOPIC LYSIS OF ADHESIONS;  Surgeon: Florian Buff, MD;  Location: [facility];  Service: Gynecology;  Laterality: N/A;  . LAPAROSCOPIC SALPINGO OOPHERECTOMY Left 10/10/2013   Procedure: LAPAROSCOPIC LEFT SALPINGO OOPHORECTOMY;  Surgeon: Florian Buff, MD;  Location: [facility];  Service: Gynecology;  Laterality: Left;  . TUBAL LIGATION      There were no vitals filed for this visit.  Subjective Assessment - 12/20/17 1125    Subjective  PT states that her  pain keeps moving around.  She uses her tens when she is hurting.    Limitations  Sitting;Lifting;Reading;Standing;Walking;House hold activities    How long can you sit comfortably?  15-20 minutes was 10  minutes     How long can you stand comfortably?  5-10 minutees     How long can you walk comfortably?  5 minutes     Patient Stated Goals  improved function and decreased pain.  Improved sleeping:  having difficulty getting to sleep: tosses and turns 2-3 hours prior to sleeping.      Currently in Pain?  Yes    Pain Score  5     Pain Location  Back    Pain Orientation  Lower    Pain Descriptors / Indicators  Burning    Pain Type  Chronic pain    Pain Onset  More than a month ago    Pain Frequency  Intermittent    Aggravating Factors   sitting     Pain Relieving Factors  movement     Effect of Pain on Daily Activities  limits               [facility] Adult PT Treatment/Exercise - 12/20/17 0001      Exercises   Exercises  Lumbar  Lumbar Exercises: Standing   Lifting Limitations  green tband hip abduction/extension B 15 x each     Forward Lunge Limitations  lunge walking x 2 RT    Wall Slides  10 reps    Shoulder Adduction Limitations  side stepping t band x 2 RT     Other Standing Lumbar Exercises  Palvo tandem on foam with green t band 10 x each side. wall arch x 10; wall push up 15'     Other Standing Lumbar Exercises  vector stance 10"      Lumbar Exercises: Seated   Sit to Stand  20 reps;Limitations    Sit to Stand Limitations  10 with right in back; 10 with left LE in back       Lumbar Exercises: Quadruped   Opposite Arm/Leg Raise  Right arm/Left leg;Left arm/Right leg;10 reps    Opposite Arm/Leg Raise Limitations  3-5" holds, tissue box as external cue for core stabilization    Other Quadruped Lumbar Exercises  firehydrants 2x10 reps each      Manual Therapy   Manual Therapy  Soft tissue mobilization    Manual therapy comments  Manual complete separate than rest  of tx    Soft tissue mobilization  very light and light STM to bil lumbar paraspinals and thoracolumbar fascia, L>R        PT Short Term Goals - 12/15/17 1514      PT SHORT TERM GOAL #1   Title  Pt pain to be no greater than a 6/10 to allow pt to be able to sleep within 1.5 hours of going to sleep     Baseline  10/31: pain is up and down, can get up to 7-8/10 and averages 6/10 daily basis; falls asleep wihtin 33mns usually    Time  3    Period  Weeks    Status  Partially Met      PT SHORT TERM GOAL #2   Title  Pt to be able to sit for 30 minutes without experiencing increased pain to allow pt to eat a meal in comfort.     Baseline  10/31: 30-446ms    Time  3    Period  Weeks    Status  Achieved      PT SHORT TERM GOAL #3   Title  PT to be able to stand for 15 minutes in comfort in order to shower without increased pain.     Baseline  10/31: 30-4519m comfortably, shifting weight    Time  3    Period  Weeks    Status  Achieved      PT SHORT TERM GOAL #4   Title  Pt core and LE strength to be increased 1/2 grade to allow pt to come from sit to stand with ease.     Time  3    Period  Weeks    Status  Partially Met        PT Long Term Goals - 12/15/17 1517      PT LONG TERM GOAL #1   Title  Pt pain to be no greater than a 3/10 to allow pt to get to sleep within 45 minutes     Baseline  10/31: pain is up and down, can get up to 7-8/10 and averages 6/10 daily basis; falls asleep wihtin 55m54musually    Time  6    Period  Weeks    Status  Partially Met  PT LONG TERM GOAL #2   Title  PT to be able to sit for an hour in comfort for job duties     Baseline  10/31: 30-79mns    Time  6    Period  Weeks    Status  On-going      PT LONG TERM GOAL #3   Title  PT to be able to stand/walk for 45 mintues in comfort to be able to complete shopping tasks.     Baseline  10/31: 30-448ms comfortably, shifting weight; walking 4532m - 1 hour and it is pain relieving factor     Time  6    Period  Weeks    Status  Achieved      PT LONG TERM GOAL #4   Title  PT core and mm strength  to be increased 1 grade to be able to go up a flight of steps without increased discomfort     Baseline  10/31: still discomfort with stairs    Time  6    Period  Weeks    Status  Partially Met            Plan - 12/20/17 1202    Clinical Impression Statement  PT continues to improve in her posture using her abdominal mm to hold her up rather than relying on her y ligament.  Added Tband hip abduction/extension due to noted weakness last reassessment.      Rehab Potential  Good    PT Frequency  2x / week    PT Duration  2 weeks    PT Treatment/Interventions  ADLs/Self Care Home Management;Therapeutic activities;Therapeutic exercise;Balance training;Gait training;Stair training;Functional mobility training;Patient/family education;Manual techniques;Cryotherapy;Moist Heat;Dry needling;Scar mobilization;Taping;Aquatic Therapy    PT Next Visit Plan  Continue to progress stabiliztion and core strengthening as well as mobility, updating HEP each session; contiue manual PRN;      PT Home Exercise Plan  Sitting tall, ab set and scapular retraction; 10/20/17: hamstring stretch and clam; 9/16: postural theraband with GTB; 10/31: qped firehydrants    Consulted and Agree with Plan of Care  Patient       Patient will benefit from skilled therapeutic intervention in order to improve the following deficits and impairments:  Decreased activity tolerance, Decreased balance, Decreased mobility, Decreased range of motion, Decreased strength, Difficulty walking, Pain, Postural dysfunction  Visit Diagnosis: Difficulty in walking, not elsewhere classified  Radiculopathy, lumbar region     Problem List Patient Active Problem List   Diagnosis Date Noted  . Vaginal discharge 05/29/2015  . Itching in the vaginal area 05/29/2015  . BV (bacterial vaginosis) 05/29/2015  . Recurrent cold sores  05/29/2015  . Generalized anxiety disorder 06/12/2014    Class: Chronic  . Depression, major, recurrent, moderate ([place]/27/2016    Class: Chronic  . Migraine headache 07/12/2012  . Anxiety state, unspecified 07/12/2012  . [facility]bid [facility] ([facility]  CynRayetta HumphreyT CLT [phone]) [phone]/06/2017, 12:06 PM  [place] [place]one: [phone]:  [phone]: TonSARAYA TIREYN: [phone] of Birth: [birth year]

## 2017-12-21 ENCOUNTER — Telehealth (HOSPITAL_COMMUNITY): Payer: Self-pay | Admitting: Internal Medicine

## 2017-12-21 NOTE — Telephone Encounter (Signed)
12/21/17  pt left a message to cx said that she wasn't feeling well

## 2017-12-22 ENCOUNTER — Encounter (HOSPITAL_COMMUNITY): Payer: Self-pay

## 2017-12-26 ENCOUNTER — Telehealth (HOSPITAL_COMMUNITY): Payer: Self-pay

## 2017-12-26 NOTE — Telephone Encounter (Signed)
Mom and son are sick and she will be here Thursday

## 2017-12-27 ENCOUNTER — Ambulatory Visit (HOSPITAL_COMMUNITY): Payer: 59

## 2017-12-29 ENCOUNTER — Ambulatory Visit (HOSPITAL_COMMUNITY): Payer: 59 | Admitting: Physical Therapy

## 2017-12-29 DIAGNOSIS — R262 Difficulty in walking, not elsewhere classified: Secondary | ICD-10-CM | POA: Diagnosis not present

## 2017-12-29 DIAGNOSIS — M5416 Radiculopathy, lumbar region: Secondary | ICD-10-CM

## 2017-12-29 NOTE — Patient Instructions (Addendum)
Upper / Lower Extremity Extension (All-Fours)    Tighten stomach and raise right leg and opposite arm. Keep trunk rigid. Repeat _10___ times per set. Do ___1_ sets per session. Do _2___ sessions per day.  http://orth.exer.us/1118   Copyright  VHI. All rights reserved.  Pelvic Tilt: Posterior - Legs Bent (Supine)    Tighten stomach and flatten back by rolling pelvis down. Hold __3-5__ seconds. Relax. Repeat _10___ times per set. Do __1__ sets per session. Do __2__ sessions per day.  http://orth.exer.us/202   Copyright  VHI. All rights reserved.  Unilateral Isometric Hip Flexion    Tighten stomach and raise right knee to outstretched arm. Push gently, keeping arm straight, trunk rigid. Hold __3-5__ seconds. Repeat _10___ times per set. Do __1__ sets per session. Do __2__ sessions per day.  http://orth.exer.us/1098   Copyright  VHI. All rights reserved.

## 2017-12-29 NOTE — Therapy (Signed)
[place], [place], [address] Phone: [phone]   Fax:  [phone]  Physical Therapy Treatment  Patient Details  Name: Crystal Rojas MRN: [id number] Date of Birth: 04-09-1980 Referring Provider (PT): Deri Fuelling   Encounter Date: 12/29/2017  PT End of Session - 12/29/17 1303    Visit Number  18    Number of Visits  18    Date for PT Re-Evaluation  12/29/17    Authorization Type  [employer]    Authorization Time Period  Cert: 42/6-->83/41/96; NEW: 12/15/17 to 12/29/17    Authorization - Visit Number  18    Authorization - Number of Visits  60    PT Start Time  2229    PT Stop Time  1339    PT Time Calculation (min)  37 min    Activity Tolerance  Patient tolerated treatment well;Patient limited by pain;No increased pain    Behavior During Therapy  WFL for tasks assessed/performed       Past Medical History:  Diagnosis Date  . Allergy   . Anxiety   . BV (bacterial vaginosis) 05/29/2015  . DDD (degenerative disc disease), lumbar   . Depression   . History of migraine headaches   . Hypertension   . Itching in the vaginal area 05/29/2015  . Morbid obesity ([place])   . Recurrent cold sores 05/29/2015  . Vaginal discharge 05/29/2015    Past Surgical History:  Procedure Laterality Date  . ABDOMINAL HYSTERECTOMY    . CHOLECYSTECTOMY    . ENDOMETRIAL ABLATION    . LAPAROSCOPIC GASTRIC SLEEVE RESECTION    . LAPAROSCOPIC LYSIS OF ADHESIONS N/A 10/10/2013   Procedure: LAPAROSCOPIC LYSIS OF ADHESIONS;  Surgeon: Florian Buff, MD;  Location: [facility];  Service: Gynecology;  Laterality: N/A;  . LAPAROSCOPIC SALPINGO OOPHERECTOMY Left 10/10/2013   Procedure: LAPAROSCOPIC LEFT SALPINGO OOPHORECTOMY;  Surgeon: Florian Buff, MD;  Location: [facility];  Service: Gynecology;  Laterality: Left;  . TUBAL LIGATION      There were no vitals filed for this visit.  Subjective Assessment - 12/29/17 1318    Subjective  PT states she has  been ill so she has not been able to come to therapy.      Limitations  Sitting;Lifting;Reading;Standing;Walking;House hold activities    How long can you sit comfortably?  30-45 minutes was 10  minutes     How long can you stand comfortably?  15 minutes was 5-10 minutees     How long can you walk comfortably?  able to walk for 30-45 minutes now 5 minutes     Patient Stated Goals  improved function and decreased pain.  Improved sleeping:  having difficulty getting to sleep: tosses and turns 2-3 hours prior to sleeping.      Currently in Pain?  Yes    Pain Score  3    will go up as high as a 7/10    Pain Location  Back    Pain Orientation  Lower    Pain Descriptors / Indicators  Aching    Pain Onset  More than a month ago    Pain Frequency  Intermittent    Aggravating Factors   not sure     Pain Relieving Factors  lie down     Effect of Pain on Daily Activities  limits         [facility] PT Assessment - 12/29/17 0001      Assessment  Medical Diagnosis  LT lumbar radiculopathy    Referring Provider (PT)  Deri Fuelling    Onset Date/Surgical Date  07/29/17    Next MD Visit  12/28/17    Prior Therapy  none      Observation/Other Assessments   Focus on Therapeutic Outcomes (FOTO)   42 (49% limited was 60)      Single Leg Stance   Comments  RT: 60; Lt: 6       Sit to Stand   Comments  5x 14.33 was 26.35      AROM   AROM Assessment Site  Lumbar    Lumbar Extension  25   was 12    Lumbar - Right Side Bend  20   was 14    Lumbar - Left Side Bend  20   was 20      Strength   Right Hip Flexion  5/5   was 4+   Right Hip Extension  5/5   was 4   Right Hip ABduction  5/5   was 4+   Left Hip Flexion  5/5   was 4+   Left Hip Extension  5/5   was 4-   Left Hip ABduction  5/5   was 4   Right Knee Flexion  5/5    Right Knee Extension  5/5    Left Knee Flexion  5/5   was 4+   Left Knee Extension  5/5   was 4+   Right Ankle Dorsiflexion  5/5   was 4+   Left Ankle  Dorsiflexion  4+/5   was 4+     Ambulation/Gait   Ambulation Distance (Feet)  738 Feet   was 452   Gait Comments  3' walk test; pt keeps head tilted to side                    [facility] Adult PT Treatment/Exercise - 12/29/17 0001      Exercises   Exercises  Lumbar      Lumbar Exercises: Supine   Pelvic Tilt  10 reps    Isometric Hip Flexion  10 reps      Lumbar Exercises: Quadruped   Opposite Arm/Leg Raise  Right arm/Left leg;Left arm/Right leg;10 reps               PT Short Term Goals - 12/29/17 1321      PT SHORT TERM GOAL #1   Title  Pt pain to be no greater than a 6/10 to allow pt to be able to sleep within 1.5 hours of going to sleep     Baseline  10/31: pain is up and down, can get up to 7-8/10 and averages 6/10 daily basis; falls asleep wihtin 58mns usually  11/14:  able to fall asleep in 30 minutes.     Time  3    Period  Weeks    Status  Achieved      PT SHORT TERM GOAL #2   Title  Pt to be able to sit for 30 minutes without experiencing increased pain to allow pt to eat a meal in comfort.     Baseline  10/31: 30-418ms    Time  3    Period  Weeks    Status  Achieved      PT SHORT TERM GOAL #3   Title  PT to be able to stand for 15 minutes in comfort in order to shower without increased pain.  Baseline  10/31: 30-8mns comfortably, shifting weight    Time  3    Period  Weeks    Status  Achieved      PT SHORT TERM GOAL #4   Title  Pt core and LE strength to be increased 1/2 grade to allow pt to come from sit to stand with ease.     Time  3    Period  Weeks    Status  Achieved        PT Long Term Goals - 12/29/17 1322      PT LONG TERM GOAL #1   Title  Pt pain to be no greater than a 3/10 to allow pt to get to sleep within 45 minutes     Baseline  10/31: pain is up and down, can get up to 7-8/10 and averages 6/10 daily basis; falls asleep wihtin 422ms usually; 11/14: can sleep withing 30 minutes but pain will go as high as a 7/10  pain    Time  6    Period  Weeks    Status  Partially Met      PT LONG TERM GOAL #2   Title  PT to be able to sit for an hour in comfort for job duties     Baseline  10/31: 30-4526m    Time  6    Period  Weeks    Status  On-going      PT LONG TERM GOAL #3   Title  PT to be able to stand/walk for 45 mintues in comfort to be able to complete shopping tasks.     Baseline  10/31: 30-68m43mcomfortably, shifting weight; walking 68mi58m 1 hour and it is pain relieving factor    Time  6    Period  Weeks    Status  Achieved      PT LONG TERM GOAL #4   Title  PT core and mm strength  to be increased 1 grade to be able to go up a flight of steps without increased discomfort     Baseline  10/31: still discomfort with stairs    Time  6    Period  Weeks    Status  Partially Met            Plan - 12/29/17 1342    Clinical Impression Statement  PT has only been to therapy one time since reassessment on 12/15/2017 due to illness.  Despite this pt continues to improve in her strength and her posture.  Pt is much more aware when she is leaning into her Y ligament and will activate her abdominal mm to adjust.  PT ROM, Strength and functional ability have all improved.  She is going to a fitness center and I anticipate her pain to continue to come down.  Recommend discharge at this time.     Rehab Potential  Good    PT Frequency  2x / week    PT Duration  2 weeks    PT Treatment/Interventions  ADLs/Self Care Home Management;Therapeutic activities;Therapeutic exercise;Balance training;Gait training;Stair training;Functional mobility training;Patient/family education;Manual techniques;Cryotherapy;Moist Heat;Dry needling;Scar mobilization;Taping;Aquatic Therapy    PT Next Visit Plan  Discharge.     PT Home Exercise Plan  Sitting tall, ab set and scapular retraction; 10/20/17: hamstring stretch and clam; 9/16: postural theraband with GTB; 10/31: qped firehydrants    Consulted and Agree with Plan of  Care  Patient       Patient will benefit from skilled therapeutic  intervention in order to improve the following deficits and impairments:  Decreased activity tolerance, Decreased balance, Decreased mobility, Decreased range of motion, Decreased strength, Difficulty walking, Pain, Postural dysfunction  Visit Diagnosis: Difficulty in walking, not elsewhere classified  Radiculopathy, lumbar region     Problem List Patient Active Problem List   Diagnosis Date Noted  . Vaginal discharge 05/29/2015  . Itching in the vaginal area 05/29/2015  . BV (bacterial vaginosis) 05/29/2015  . Recurrent cold sores 05/29/2015  . Generalized anxiety disorder 06/12/2014    Class: Chronic  . Depression, major, recurrent, moderate ([place]) 06/12/2014    Class: Chronic  . Migraine headache 07/12/2012  . Anxiety state, unspecified 07/12/2012  . Morbid obesity [facility]) 07/12/2012    Rayetta Humphrey, PT CLT [phone] 12/29/2017, 1:45 PM  [place] [address] [place], [place], [address] Phone: [phone]   Fax:  [phone]  Name: Crystal Rojas MRN: [id number] Date of Birth: 08-03-1980

## 2019-03-28 ENCOUNTER — Ambulatory Visit: Payer: Self-pay | Attending: Internal Medicine

## 2019-03-28 ENCOUNTER — Other Ambulatory Visit: Payer: Self-pay

## 2019-03-28 DIAGNOSIS — Z20822 Contact with and (suspected) exposure to covid-19: Secondary | ICD-10-CM | POA: Insufficient documentation

## 2019-03-29 LAB — NOVEL CORONAVIRUS, NAA: SARS-CoV-2, NAA: NOT DETECTED

## 2019-09-08 ENCOUNTER — Ambulatory Visit: Payer: Self-pay

## 2019-09-08 ENCOUNTER — Ambulatory Visit
Admission: EM | Admit: 2019-09-08 | Discharge: 2019-09-08 | Disposition: A | Discharge status: Home / Self Care | Payer: Self-pay | Attending: Emergency Medicine | Admitting: Emergency Medicine

## 2019-09-08 ENCOUNTER — Other Ambulatory Visit: Payer: Self-pay

## 2019-09-08 DIAGNOSIS — H66002 Acute suppurative otitis media without spontaneous rupture of ear drum, left ear: Secondary | ICD-10-CM

## 2019-09-08 DIAGNOSIS — J069 Acute upper respiratory infection, unspecified: Secondary | ICD-10-CM

## 2019-09-08 MED ORDER — AMOXICILLIN 500 MG PO CAPS: 500.0000 mg | ORAL_CAPSULE | Freq: Two times a day (BID) | ORAL | 0 refills | Status: AC

## 2019-09-08 MED ORDER — BENZONATATE 100 MG PO CAPS: 100.0000 mg | ORAL_CAPSULE | Freq: Three times a day (TID) | ORAL | 0 refills | Status: DC

## 2019-09-08 NOTE — ED Provider Notes (Signed)
[facility]   [id number] 09/08/19 Arrival Time: 0940   CC: URI  SUBJECTIVE: History from: patient.  Crystal Rojas is a 39 y.o. female who presents with ear pain, sore throat, runny nose, congestion, and cough x 3 days.  Child with cold symptoms.  Has tried OTC medications without relief.  Denies aggravating factors.  Reports previous symptoms in the past.   Denies fever, SOB, wheezing, chest pain, nausea, changes in bowel or bladder habits.    ROS: As per HPI.  All other pertinent ROS negative.     Past Medical History:  Diagnosis Date  . Allergy   . Anxiety   . BV (bacterial vaginosis) 05/29/2015  . DDD (degenerative disc disease), lumbar   . Depression   . History of migraine headaches   . Hypertension   . Itching in the vaginal area 05/29/2015  . Morbid obesity ([facility])   . Recurrent cold sores 05/29/2015  . Vaginal discharge 05/29/2015   Past Surgical History:  Procedure Laterality Date  . ABDOMINAL HYSTERECTOMY    . CHOLECYSTECTOMY    . ENDOMETRIAL ABLATION    . LAPAROSCOPIC GASTRIC SLEEVE RESECTION    . LAPAROSCOPIC LYSIS OF ADHESIONS N/A 10/10/2013   Procedure: LAPAROSCOPIC LYSIS OF ADHESIONS;  Surgeon: Lazaro Arms, MD;  Location: [facility];  Service: Gynecology;  Laterality: N/A;  . LAPAROSCOPIC SALPINGO OOPHERECTOMY Left 10/10/2013   Procedure: LAPAROSCOPIC LEFT SALPINGO OOPHORECTOMY;  Surgeon: Lazaro Arms, MD;  Location: [facility];  Service: Gynecology;  Laterality: Left;  . TUBAL LIGATION     No Known Allergies No current facility-administered medications on file prior to encounter.   Current Outpatient Medications on File Prior to Encounter  Medication Sig Dispense Refill  . cetirizine (ZYRTEC) 10 MG tablet Take 10 mg by mouth at bedtime.     Social History   Socioeconomic History  . Marital status: Married    Spouse name: Not on file  . Number of children: Not on file  . Years of education: Not on file  . Highest education level: Not on file    Occupational History  . Not on file  Tobacco Use  . Smoking status: Never Smoker  . Smokeless tobacco: Never Used  Substance and Sexual Activity  . Alcohol use: No  . Drug use: No  . Sexual activity: Yes    Birth control/protection: None, Surgical    Comment: hyst  Other Topics Concern  . Not on file  Social History Narrative   Marital Status: Married Film/video editor)   Children:  Son Janyth Pupa) Daughters ([place], [place])   Pets: Egbert Garibaldi  (1)    Living Situation: Lives with husband and children     Occupation: Training and development officer  Administrator)     Education: Engineer, agricultural     Tobacco Use/Exposure:  None    Alcohol Use:  None    Drug Use:  None   Diet:  Regular   Exercise:  Walking (Twice per week)    Hobbies: Traveling         Social Determinants of Health   Financial Resource Strain:   . Difficulty of Paying Living Expenses:   Food Insecurity:   . Worried About Programme researcher, broadcasting/film/video in the Last Year:   . Barista in the Last Year:   Transportation Needs:   . Freight forwarder (Medical):   Marland Kitchen Lack of Transportation (Non-Medical):   Physical Activity:   . Days of Exercise per Week:   .  Minutes of Exercise per Session:   Stress:   . Feeling of Stress :   Social Connections:   . Frequency of Communication with Friends and Family:   . Frequency of Social Gatherings with Friends and Family:   . Attends Religious Services:   . Active Member of Clubs or Organizations:   . Attends Banker Meetings:   Marland Kitchen Marital Status:   Intimate Partner Violence:   . Fear of Current or Ex-Partner:   . Emotionally Abused:   Marland Kitchen Physically Abused:   . Sexually Abused:    Family History  Problem Relation Age of Onset  . Diabetes Mother   . Hypertension Mother   . Cancer Mother        kidney  . Hypertension Father   . Depression Father   . Diabetes Maternal Grandmother   . COPD Maternal Grandmother   . Depression Paternal Grandmother     OBJECTIVE:  Vitals:    09/08/19 0956  BP: (!) 152/96  Pulse: 78  Resp: 17  Temp: 98.1 F (36.7 C)  TempSrc: Oral  SpO2: 95%    General appearance: alert; appears mildly fatigued, but nontoxic; speaking in full sentences and tolerating own secretions HEENT: NCAT; Ears: EACs clear, LT TM pearly gray, RT TM erythematous; Eyes: PERRL.  EOM grossly intact. Nose: nares patent without rhinorrhea, Throat: oropharynx clear, tonsils non erythematous or enlarged, uvula midline  Neck: supple without LAD Lungs: unlabored respirations, symmetrical air entry; cough: absent; no respiratory distress; CTAB Heart: regular rate and rhythm.  Skin: warm and dry Psychological: alert and cooperative; normal mood and affect  ASSESSMENT & PLAN:  1. Viral URI with cough   2. Non-recurrent acute suppurative otitis media of left ear without spontaneous rupture of tympanic membrane     Meds ordered this encounter  Medications  . amoxicillin (AMOXIL) 500 MG capsule    Sig: Take 1 capsule (500 mg total) by mouth 2 (two) times daily for 10 days.    Dispense:  20 capsule    Refill:  0    Order Specific Question:   Supervising Provider    Answer:   Eustace Moore [[id number]]  . benzonatate (TESSALON) 100 MG capsule    Sig: Take 1 capsule (100 mg total) by mouth every 8 (eight) hours.    Dispense:  21 capsule    Refill:  0    Order Specific Question:   Supervising Provider    Answer:   Eustace Moore [[id number]]   Declines COVID test at this time Get plenty of rest and push fluids Tessalon Perles prescribed for cough Use OTC zyrtec for nasal congestion, runny nose, and/or sore throat Use OTC flonase for nasal congestion and runny nose Amoxicillin for LT ear infection Use OTC medications like ibuprofen or tylenol as needed fever or pain Follow up with PCP  Call or go to the ED if you have any new or worsening symptoms such as fever, worsening cough, shortness of breath, chest tightness, chest pain, turning blue, changes in  mental status, etc...   Reviewed expectations re: course of current medical issues. Questions answered. Outlined signs and symptoms indicating need for more acute intervention. Patient verbalized understanding. After Visit Summary given.         Rennis Harding, PA-C 09/08/19 1032

## 2019-09-08 NOTE — ED Triage Notes (Signed)
Pt presents with sore throat and bilateral ear pressure x 3 days.

## 2019-09-08 NOTE — Discharge Instructions (Signed)
Declines COVID test at this time Get plenty of rest and push fluids Tessalon Perles prescribed for cough Use OTC zyrtec for nasal congestion, runny nose, and/or sore throat Use OTC flonase for nasal congestion and runny nose Amoxicillin for LT ear infection Use OTC medications like ibuprofen or tylenol as needed fever or pain Follow up with PCP  Call or go to the ED if you have any new or worsening symptoms such as fever, worsening cough, shortness of breath, chest tightness, chest pain, turning blue, changes in mental status, etc..Marland Kitchen

## 2019-12-11 ENCOUNTER — Other Ambulatory Visit: Payer: Self-pay | Admitting: Physician Assistant

## 2019-12-11 DIAGNOSIS — M7989 Other specified soft tissue disorders: Secondary | ICD-10-CM

## 2019-12-14 ENCOUNTER — Other Ambulatory Visit: Payer: Self-pay

## 2020-01-04 ENCOUNTER — Ambulatory Visit
Admission: RE | Admit: 2020-01-04 | Discharge: 2020-01-04 | Disposition: A | Discharge status: Home / Self Care | Payer: 59 | Source: Ambulatory Visit | Attending: Physician Assistant | Admitting: Physician Assistant

## 2020-01-04 DIAGNOSIS — M7989 Other specified soft tissue disorders: Secondary | ICD-10-CM

## 2020-03-17 ENCOUNTER — Other Ambulatory Visit: Payer: Self-pay | Admitting: Orthopaedic Surgery

## 2020-03-17 DIAGNOSIS — M4726 Other spondylosis with radiculopathy, lumbar region: Secondary | ICD-10-CM

## 2020-03-20 ENCOUNTER — Ambulatory Visit: Payer: 59 | Admitting: Orthopaedic Surgery

## 2020-03-31 ENCOUNTER — Ambulatory Visit
Admission: RE | Admit: 2020-03-31 | Discharge: 2020-03-31 | Disposition: A | Discharge status: Home / Self Care | Payer: 59 | Source: Ambulatory Visit | Attending: Orthopaedic Surgery | Admitting: Orthopaedic Surgery

## 2020-03-31 ENCOUNTER — Other Ambulatory Visit: Payer: Self-pay

## 2020-03-31 DIAGNOSIS — M4726 Other spondylosis with radiculopathy, lumbar region: Secondary | ICD-10-CM

## 2020-04-04 ENCOUNTER — Other Ambulatory Visit: Payer: 59

## 2021-04-27 ENCOUNTER — Other Ambulatory Visit: Payer: Self-pay

## 2021-04-27 ENCOUNTER — Encounter: Payer: Self-pay | Admitting: Obstetrics & Gynecology

## 2021-04-27 ENCOUNTER — Ambulatory Visit (INDEPENDENT_AMBULATORY_CARE_PROVIDER_SITE_OTHER): Payer: 59 | Admitting: Obstetrics & Gynecology

## 2021-04-27 VITALS — BP 126/86 | HR 90 | Ht 63.0 in | Wt 249.0 lb

## 2021-04-27 DIAGNOSIS — Z01419 Encounter for gynecological examination (general) (routine) without abnormal findings: Secondary | ICD-10-CM | POA: Diagnosis not present

## 2021-04-27 DIAGNOSIS — Z1321 Encounter for screening for nutritional disorder: Secondary | ICD-10-CM | POA: Diagnosis not present

## 2021-04-27 DIAGNOSIS — Z136 Encounter for screening for cardiovascular disorders: Secondary | ICD-10-CM | POA: Diagnosis not present

## 2021-04-27 DIAGNOSIS — Z1329 Encounter for screening for other suspected endocrine disorder: Secondary | ICD-10-CM | POA: Diagnosis not present

## 2021-04-27 NOTE — Progress Notes (Unsigned)
Subjective:     Crystal Rojas is a 41 y.o. female here for a routine exam.  Patient's last menstrual period was 09/04/2012. [id number] Birth Control Method:  *** Menstrual Calendar(currently): ***  Current complaints: ***.   Current acute medical issues:  ***   Recent Gynecologic History Patient's last menstrual period was 09/04/2012. Last Pap: ***,  {norm/abn:16337} Last mammogram: ***,  {norm/abn:16337}  Past Medical History:  Diagnosis Date   Allergy    Anxiety    BV (bacterial vaginosis) 05/29/2015   DDD (degenerative disc disease), lumbar    Depression    History of migraine headaches    Hypertension    Itching in the vaginal area 05/29/2015   Morbid obesity ([facility])    Recurrent cold sores 05/29/2015   Vaginal discharge 05/29/2015    Past Surgical History:  Procedure Laterality Date   ABDOMINAL HYSTERECTOMY     CHOLECYSTECTOMY     ENDOMETRIAL ABLATION     LAPAROSCOPIC GASTRIC SLEEVE RESECTION     LAPAROSCOPIC LYSIS OF ADHESIONS N/A 10/10/2013   Procedure: LAPAROSCOPIC LYSIS OF ADHESIONS;  Surgeon: Lazaro Arms, MD;  Location: [facility];  Service: Gynecology;  Laterality: N/A;   LAPAROSCOPIC SALPINGO OOPHERECTOMY Left 10/10/2013   Procedure: LAPAROSCOPIC LEFT SALPINGO OOPHORECTOMY;  Surgeon: Lazaro Arms, MD;  Location: [facility];  Service: Gynecology;  Laterality: Left;   TUBAL LIGATION      OB History     Gravida  4   Para  3   Term      Preterm      AB  1   Living  3      SAB  1   IAB      Ectopic      Multiple      Live Births              Social History   Socioeconomic History   Marital status: Married    Spouse name: Not on file   Number of children: Not on file   Years of education: Not on file   Highest education level: Not on file  Occupational History   Not on file  Tobacco Use   Smoking status: Never   Smokeless tobacco: Never  Substance and Sexual Activity   Alcohol use: No   Drug use: No   Sexual activity: Yes    Birth  control/protection: None, Surgical    Comment: hyst  Other Topics Concern   Not on file  Social History Narrative   Marital Status: Married Film/video editor)   Children:  Son Janyth Pupa) Daughters ([place], [place])   Pets: Egbert Garibaldi  (1)    Living Situation: Lives with husband and children     Occupation: Training and development officer  Administrator)     Education: Engineer, agricultural     Tobacco Use/Exposure:  None    Alcohol Use:  None    Drug Use:  None   Diet:  Regular   Exercise:  Walking (Twice per week)    Hobbies: Traveling         Social Determinants of Health   Financial Resource Strain: Low Risk    Difficulty of Paying Living Expenses: Not hard at all  Food Insecurity: No Food Insecurity   Worried About Programme researcher, broadcasting/film/video in the Last Year: Never true   Barista in the Last Year: Never true  Transportation Needs: No Transportation Needs   Lack of Transportation (Medical): No   Lack of Transportation (  Non-Medical): No  Physical Activity: Sufficiently Active   Days of Exercise per Week: 4 days   Minutes of Exercise per Session: 50 min  Stress: Stress Concern Present   Feeling of Stress : Rather much  Social Connections: Socially Integrated   Frequency of Communication with Friends and Family: More than three times a week   Frequency of Social Gatherings with Friends and Family: Three times a week   Attends Religious Services: More than 4 times per year   Active Member of Clubs or Organizations: Yes   Attends Banker Meetings: 1 to 4 times per year   Marital Status: Married    Family History  Problem Relation Age of Onset   Diabetes Mother    Hypertension Mother    Cancer Mother        kidney   Hypertension Father    Depression Father    Diabetes Maternal Grandmother    COPD Maternal Grandmother    Depression Paternal Grandmother      Current Outpatient Medications:    baclofen (LIORESAL) 20 MG tablet, Take 20 mg by mouth 2 (two) times daily., Disp: , Rfl:     DULoxetine (CYMBALTA) 60 MG capsule, Take by mouth., Disp: , Rfl:    HYDROcodone-acetaminophen (NORCO) 10-325 MG tablet, Take by mouth., Disp: , Rfl:    losartan-hydrochlorothiazide (HYZAAR) 100-25 MG tablet, Take 1 tablet by mouth daily., Disp: , Rfl:    phentermine 37.5 MG capsule, Take by mouth., Disp: , Rfl:    pregabalin (LYRICA) 150 MG capsule, Take by mouth., Disp: , Rfl:    tiZANidine (ZANAFLEX) 4 MG tablet, Take by mouth., Disp: , Rfl:    valACYclovir (VALTREX) 1000 MG tablet, Take by mouth., Disp: , Rfl:    TRULICITY 0.75 MG/0.5ML SOPN, Inject into the skin once a week. (Patient not taking: Reported on 04/27/2021), Disp: , Rfl:   Review of Systems  Review of Systems  Constitutional: Negative for fever, chills, weight loss, malaise/fatigue and diaphoresis.  HENT: Negative for hearing loss, ear pain, nosebleeds, congestion, sore throat, neck pain, tinnitus and ear discharge.   Eyes: Negative for blurred vision, double vision, photophobia, pain, discharge and redness.  Respiratory: Negative for cough, hemoptysis, sputum production, shortness of breath, wheezing and stridor.   Cardiovascular: Negative for chest pain, palpitations, orthopnea, claudication, leg swelling and PND.  Gastrointestinal: negative for abdominal pain. Negative for heartburn, nausea, vomiting, diarrhea, constipation, blood in stool and melena.  Genitourinary: Negative for dysuria, urgency, frequency, hematuria and flank pain.  Musculoskeletal: Negative for myalgias, back pain, joint pain and falls.  Skin: Negative for itching and rash.  Neurological: Negative for dizziness, tingling, tremors, sensory change, speech change, focal weakness, seizures, loss of consciousness, weakness and headaches.  Endo/Heme/Allergies: Negative for environmental allergies and polydipsia. Does not bruise/bleed easily.  Psychiatric/Behavioral: Negative for depression, suicidal ideas, hallucinations, memory loss and substance abuse.  The patient is not nervous/anxious and does not have insomnia.        Objective:  Blood pressure 126/86, pulse 90, height 5\' 3"  (1.6 m), weight 249 lb (112.9 kg), last menstrual period 09/04/2012.   Physical Exam  Vitals reviewed. Constitutional: She is oriented to person, place, and time. She appears well-developed and well-nourished.  HENT:  Head: Normocephalic and atraumatic.        Right Ear: External ear normal.  Left Ear: External ear normal.  Nose: Nose normal.  Mouth/Throat: Oropharynx is clear and moist.  Eyes: Conjunctivae and EOM are normal. Pupils are equal,  round, and reactive to light. Right eye exhibits no discharge. Left eye exhibits no discharge. No scleral icterus.  Neck: Normal range of motion. Neck supple. No tracheal deviation present. No thyromegaly present.  Cardiovascular: Normal rate, regular rhythm, normal heart sounds and intact distal pulses.  Exam reveals no gallop and no friction rub.   No murmur heard. Respiratory: Effort normal and breath sounds normal. No respiratory distress. She has no wheezes. She has no rales. She exhibits no tenderness.  GI: Soft. Bowel sounds are normal. She exhibits no distension and no mass. There is no tenderness. There is no rebound and no guarding.  Genitourinary:  Breasts no masses skin changes or nipple changes bilaterally      Vulva is normal without lesions Vagina is pink moist without discharge Cervix normal in appearance and pap is {Desc; done/not:10129} Uterus is normal size shape and contour Adnexa is negative with normal sized ovaries *** {Rectal   *** hemoccult negative, normal tone, no masses } Musculoskeletal: Normal range of motion. She exhibits no edema and no tenderness.  Neurological: She is alert and oriented to person, place, and time. She has normal reflexes. She displays normal reflexes. No cranial nerve deficit. She exhibits normal muscle tone. Coordination normal.  Skin: Skin is warm and dry. No rash  noted. No erythema. No pallor.  Psychiatric: She has a normal mood and affect. Her behavior is normal. Judgment and thought content normal.       Medications Ordered at today's visit: No orders of the defined types were placed in this encounter.   Other orders placed at today's visit: No orders of the defined types were placed in this encounter.     Assessment:    Normal Gyn exam.    Plan:    {plan:19193}     No follow-ups on file.

## 2021-05-05 ENCOUNTER — Other Ambulatory Visit: Payer: Self-pay | Admitting: Orthopaedic Surgery

## 2021-05-05 DIAGNOSIS — M5416 Radiculopathy, lumbar region: Secondary | ICD-10-CM

## 2021-05-06 LAB — LIPID PANEL
Chol/HDL Ratio: 4.9 ratio — ABNORMAL HIGH (ref 0.0–4.4)
Cholesterol, Total: 201 mg/dL — ABNORMAL HIGH (ref 100–199)
HDL: 41 mg/dL (ref >39)
LDL Chol Calc (NIH): 106 mg/dL — ABNORMAL HIGH (ref 0–99)
Triglycerides: 313 mg/dL — ABNORMAL HIGH (ref 0–149)
VLDL Cholesterol Cal: 54 mg/dL — ABNORMAL HIGH (ref 5–40)

## 2021-05-06 LAB — VITAMIN D 1,25 DIHYDROXY
Vitamin D 1, 25 (OH)2 Total: 55 pg/mL
Vitamin D2 1, 25 (OH)2: <10 pg/mL
Vitamin D3 1, 25 (OH)2: 52 pg/mL

## 2021-05-06 LAB — TSH: TSH: 1.27 u[IU]/mL (ref 0.450–4.500)

## 2021-05-12 ENCOUNTER — Other Ambulatory Visit: Payer: Self-pay

## 2021-05-12 ENCOUNTER — Ambulatory Visit
Admission: RE | Admit: 2021-05-12 | Discharge: 2021-05-12 | Disposition: A | Discharge status: Home / Self Care | Payer: 59 | Source: Ambulatory Visit | Attending: Orthopaedic Surgery | Admitting: Orthopaedic Surgery

## 2021-05-12 DIAGNOSIS — M5416 Radiculopathy, lumbar region: Secondary | ICD-10-CM

## 2021-05-12 MED ORDER — DIAZEPAM 5 MG PO TABS
10.0000 mg | ORAL_TABLET | Freq: Once | ORAL | Status: AC
  Administered 2021-05-12: 10 mg via ORAL

## 2021-05-12 MED ORDER — IOPAMIDOL (ISOVUE-M 200) INJECTION 41%
15.0000 mL | Freq: Once | INTRAMUSCULAR | Status: AC
  Administered 2021-05-12: 15 mL via INTRATHECAL

## 2021-05-12 MED ORDER — MEPERIDINE HCL 50 MG/ML IJ SOLN
50.0000 mg | Freq: Once | INTRAMUSCULAR | Status: AC | PRN
  Administered 2021-05-12: 50 mg via INTRAMUSCULAR

## 2021-05-12 MED ORDER — ONDANSETRON HCL 4 MG/2ML IJ SOLN
4.0000 mg | Freq: Once | INTRAMUSCULAR | Status: AC | PRN
  Administered 2021-05-12: 4 mg via INTRAMUSCULAR

## 2021-05-12 NOTE — Discharge Instructions (Signed)

## 2021-05-12 NOTE — Discharge Instr - Other Orders (Addendum)
1115: pt c/o pain 7/10 from myelogram procedure. Achy pain in back. See mar.  ?[id number]: pt back in nursing recovery area post procedure. Pt ranks pain 4/10 and states she is comfortable. Will stay in nursing area until d/c ?

## 2021-05-13 ENCOUNTER — Encounter: Payer: Self-pay | Admitting: Obstetrics & Gynecology

## 2021-05-19 ENCOUNTER — Other Ambulatory Visit: Payer: Self-pay

## 2021-05-19 ENCOUNTER — Encounter: Payer: Self-pay | Admitting: Neurology

## 2021-05-19 DIAGNOSIS — R202 Paresthesia of skin: Secondary | ICD-10-CM

## 2021-08-21 ENCOUNTER — Encounter (INDEPENDENT_AMBULATORY_CARE_PROVIDER_SITE_OTHER): Payer: Self-pay | Admitting: *Deleted

## 2021-08-28 ENCOUNTER — Encounter (INDEPENDENT_AMBULATORY_CARE_PROVIDER_SITE_OTHER): Payer: Self-pay | Admitting: *Deleted

## 2021-10-13 ENCOUNTER — Encounter: Payer: 59 | Admitting: Neurology

## 2021-10-22 ENCOUNTER — Ambulatory Visit (INDEPENDENT_AMBULATORY_CARE_PROVIDER_SITE_OTHER): Payer: 59 | Admitting: Gastroenterology

## 2021-11-03 ENCOUNTER — Ambulatory Visit (INDEPENDENT_AMBULATORY_CARE_PROVIDER_SITE_OTHER): Payer: 59 | Admitting: Gastroenterology

## 2022-10-05 ENCOUNTER — Other Ambulatory Visit (HOSPITAL_COMMUNITY): Payer: Self-pay | Admitting: Orthopaedic Surgery

## 2022-10-05 DIAGNOSIS — M5416 Radiculopathy, lumbar region: Secondary | ICD-10-CM

## 2022-10-05 DIAGNOSIS — M5106 Intervertebral disc disorders with myelopathy, lumbar region: Secondary | ICD-10-CM

## 2022-10-07 ENCOUNTER — Ambulatory Visit (HOSPITAL_COMMUNITY)
Admission: RE | Admit: 2022-10-07 | Discharge: 2022-10-07 | Disposition: A | Discharge status: Home / Self Care | Payer: 59 | Source: Ambulatory Visit | Attending: Orthopaedic Surgery | Admitting: Orthopaedic Surgery

## 2022-10-07 DIAGNOSIS — M5416 Radiculopathy, lumbar region: Secondary | ICD-10-CM | POA: Insufficient documentation

## 2022-10-07 DIAGNOSIS — M5106 Intervertebral disc disorders with myelopathy, lumbar region: Secondary | ICD-10-CM | POA: Insufficient documentation
